# Patient Record
Sex: Female | Born: 2000 | Race: White | Hispanic: No | Marital: Single | Smoking: Never smoker
Health system: Southern US, Community
[De-identification: ages and names within clinical notes are randomized; demographics above are authoritative.]

## PROBLEM LIST (undated history)

## (undated) DIAGNOSIS — G43D0 Abdominal migraine, not intractable: Secondary | ICD-10-CM

## (undated) DIAGNOSIS — F419 Anxiety disorder, unspecified: Secondary | ICD-10-CM

## (undated) HISTORY — PX: TYMPANOSTOMY TUBE PLACEMENT: SHX32

## (undated) HISTORY — PX: NO PAST SURGERIES: SHX2092

## (undated) HISTORY — PX: ADENOIDECTOMY: SUR15

---
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
## 2020-02-01 ENCOUNTER — Other Ambulatory Visit: Payer: Self-pay

## 2020-02-01 ENCOUNTER — Encounter: Payer: Self-pay | Admitting: Nurse Practitioner

## 2020-02-01 ENCOUNTER — Ambulatory Visit: Payer: Federal, State, Local not specified - PPO | Admitting: Nurse Practitioner

## 2020-02-01 VITALS — BP 117/75 | HR 103 | Temp 98.6°F | Wt 132.6 lb

## 2020-02-01 DIAGNOSIS — D229 Melanocytic nevi, unspecified: Secondary | ICD-10-CM

## 2020-02-01 DIAGNOSIS — F411 Generalized anxiety disorder: Secondary | ICD-10-CM | POA: Diagnosis not present

## 2020-02-01 MED ORDER — FLUOXETINE HCL 40 MG PO CAPS: 40.0000 mg | ORAL_CAPSULE | Freq: Every day | ORAL | 5 refills | Status: DC

## 2020-02-01 NOTE — Assessment & Plan Note (Signed)
Small, < 2 cm, to back.  Educated her on these and will continue to monitor.  If any changes will send to dermatology for further assessment or biopsy in office.

## 2020-02-01 NOTE — Patient Instructions (Signed)

## 2020-02-01 NOTE — Progress Notes (Signed)
BP 117/75   Pulse (!) 103   Temp 98.6 F (37 C) (Oral)   Wt 132 lb 9.6 oz (60.1 kg)   LMP 01/07/2020 (Approximate)   SpO2 98%   BMI 24.25 kg/m    Subjective:    Patient ID: Brenda Rangel, female    DOB: [birth year], 19 y.o.   MRN: [id number]  HPI: Brenda Rangel is a 19 y.o. female  Chief Complaint  Patient presents with  . Anxiety  . Nevus    pt states she has 2 spots on her back that she would like looked at    ANXIETY/STRESS Increased Prozac last visit to 20 MG.  Prozac is helping somewhat, but not 100%.  Reports a lot of life issues taking place that have made anxiety worse.  Has tried Zoloft in past with no benefit.  She is going back at end of May to [facility], to her previous Social worker.  Has not felt need to use Vistaril. Duration:somewhat better, but not 100% Anxious mood: increased lately Excessive worrying: yes Irritability: at times  Sweating: no Nausea: no Palpitations:no Hyperventilation: no Panic attacks: no Agoraphobia: no  Obscessions/compulsions: at times Depressed mood: no Depression screen [facility] 2/9 02/01/2020 12/19/2019 11/16/2019 08/28/2019  Decreased Interest 1 2 2  0  Down, Depressed, Hopeless 2 0 2 0  PHQ - 2 Score 3 2 4  0  Altered sleeping 2 2 3 1   Tired, decreased energy 1 1 2 1   Change in appetite 2 3 2  0  Feeling bad or failure about yourself  2 1 1  0  Trouble concentrating 1 2 2  0  Moving slowly or fidgety/restless 3 2 1  0  Suicidal thoughts 0 0 0 0  PHQ-9 Score 14 13 15 2   Difficult doing work/chores Somewhat difficult Somewhat difficult Somewhat difficult Not difficult at all   Anhedonia: no Weight changes: no Insomnia: yes hard to stay asleep  Hypersomnia: no Fatigue/loss of energy: no Feelings of worthlessness: no Feelings of guilt: no Impaired concentration/indecisiveness: better Suicidal ideations: no  Crying spells: no Recent Stressors/Life Changes: yes   Relationship problems: no   Family stress: yes      Financial stress: no    Job stress: no    Recent death/loss: no GAD 7 : Generalized Anxiety Score 02/01/2020 12/19/2019 11/16/2019 08/28/2019  Nervous, Anxious, on Edge 3 2 3 1   Control/stop worrying 2 1 2  0  Worry too much - different things 3 0 3 0  Trouble relaxing 2 1 1  0  Restless 2 1 1  0  Easily annoyed or irritable 2 2 2  0  Afraid - awful might happen 1 0 1 0  Total GAD 7 Score 15 7 13 1   Anxiety Difficulty - Somewhat difficult Somewhat difficult Not difficult at all    SKIN LESION Has 2 nevi to back she would like looked at.  States her aunt has similar skin issues.  Areas have been present for several years, one a pediatrician told her to monitor only.  She states the areas have not changed or increased in size. Duration: years Location: back Painful: no Itching: no Onset: gradual Context: not changing Associated signs and symptoms: none History of skin cancer: no History of precancerous skin lesions: no Family history of skin cancer: no  Relevant past medical, surgical, family and social history reviewed and updated as indicated. Interim medical history since our last visit reviewed. Allergies and medications reviewed and updated.  Review of Systems  Constitutional: Negative for activity change,  appetite change, diaphoresis, fatigue and fever.  Respiratory: Negative for cough, chest tightness and shortness of breath.   Cardiovascular: Negative for chest pain, palpitations and leg swelling.  Psychiatric/Behavioral: Positive for sleep disturbance. Negative for decreased concentration, self-injury and suicidal ideas. The patient is nervous/anxious.     Per HPI unless specifically indicated above     Objective:    BP 117/75   Pulse (!) 103   Temp 98.6 F (37 C) (Oral)   Wt 132 lb 9.6 oz (60.1 kg)   LMP 01/07/2020 (Approximate)   SpO2 98%   BMI 24.25 kg/m   Wt Readings from Last 3 Encounters:  02/01/20 132 lb 9.6 oz (60.1 kg) (63 %, Z= 0.32)*  11/16/19 131 lb  (59.4 kg) (61 %, Z= 0.28)*  10/29/19 132 lb 8 oz (60.1 kg) (64 %, Z= 0.35)*   * Growth percentiles are based on CDC (Girls, 2-20 Years) data.    Physical Exam Vitals and nursing note reviewed.  Constitutional:      General: She is awake. She is not in acute distress.    Appearance: She is well-developed and well-groomed. She is not ill-appearing.  HENT:     Head: Normocephalic.     Right Ear: Hearing normal.     Left Ear: Hearing normal.  Eyes:     General: Lids are normal.        Right eye: No discharge.        Left eye: No discharge.     Conjunctiva/sclera: Conjunctivae normal.     Pupils: Pupils are equal, round, and reactive to light.  Neck:     Thyroid: No thyromegaly.     Vascular: No carotid bruit.  Cardiovascular:     Rate and Rhythm: Normal rate and regular rhythm.     Heart sounds: Normal heart sounds. No murmur. No gallop.   Pulmonary:     Effort: Pulmonary effort is normal. No accessory muscle usage or respiratory distress.     Breath sounds: Normal breath sounds.  Abdominal:     General: Bowel sounds are normal.     Palpations: Abdomen is soft.  Musculoskeletal:     Cervical back: Normal range of motion and neck supple.     Right lower leg: No edema.     Left lower leg: No edema.  Skin:    General: Skin is warm and dry.     Comments: Small, 1/2 cm nevi to upper middle back, light brown, symmetrical shape with regular borders, intact.  Small, pinpoint, deep brown skin tag mid-lower back, crusted, intact, no drainage.  Neurological:     Mental Status: She is alert and oriented to person, place, and time.  Psychiatric:        Attention and Perception: Attention normal.        Mood and Affect: Mood normal.        Speech: Speech normal.        Behavior: Behavior normal. Behavior is cooperative.        Thought Content: Thought content normal.     Results for orders placed or performed during the hospital encounter of [id number]  Basic metabolic panel  Result  Value Ref Range   Sodium 139 135 - 145 mmol/L   Potassium 3.8 3.5 - 5.1 mmol/L   Chloride 105 98 - 111 mmol/L   CO2 23 22 - 32 mmol/L   Glucose, Bld 107 (H) 70 - 99 mg/dL   BUN 10 6 - 20 mg/dL  Creatinine, Ser 0.62 0.44 - 1.00 mg/dL   Calcium 9.0 8.9 - 10.3 mg/dL   GFR calc non Af Amer >60 >60 mL/min   GFR calc Af Amer >60 >60 mL/min   Anion gap 11 5 - 15  CBC  Result Value Ref Range   WBC 8.8 4.0 - 10.5 K/uL   RBC 4.79 3.87 - 5.11 MIL/uL   Hemoglobin 13.9 12.0 - 15.0 g/dL   HCT 40.3 36.0 - 46.0 %   MCV 84.1 80.0 - 100.0 fL   MCH 29.0 26.0 - 34.0 pg   MCHC 34.5 30.0 - 36.0 g/dL   RDW 11.8 11.5 - 15.5 %   Platelets 261 150 - 400 K/uL   nRBC 0.0 0.0 - 0.2 %  Fibrin derivatives D-Dimer ([facility] only)  Result Value Ref Range   Fibrin derivatives D-dimer ([facility]) 225.90 0.00 - 499.00 ng/mL (FEU)  TSH  Result Value Ref Range   TSH 0.841 0.350 - 4.500 uIU/mL  Pregnancy, urine POC  Result Value Ref Range   Preg Test, Ur NEGATIVE NEGATIVE  Troponin I (High Sensitivity)  Result Value Ref Range   Troponin I (High Sensitivity) <2 <18 ng/L  Troponin I (High Sensitivity)  Result Value Ref Range   Troponin I (High Sensitivity) <2 <18 ng/L      Assessment & Plan:   Problem List Items Addressed This Visit      Other   Generalized anxiety disorder - Primary    Ongoing, treated in past with Zoloft with no benefit. PHQ 14 and GAD 15 today (slight increased from previous), denies SI/HI.   Will increase Prozac to 40 MG and continue Vistaril as needed for SEVERE anxiety episodes while Prozac dose changing.  Educated on medications.  Recommend meditation and relaxation exercises at home + therapy sessions.  Return in 4 weeks for follow-up.      Relevant Medications   FLUoxetine (PROZAC) 40 MG capsule   Multiple nevi    Small, < 2 cm, to back.  Educated her on these and will continue to monitor.  If any changes will send to dermatology for further assessment or biopsy in office.           Follow up plan: Return in about 4 weeks (around 02/29/2020) for Mood.

## 2020-02-01 NOTE — Assessment & Plan Note (Signed)
Ongoing, treated in past with Zoloft with no benefit. PHQ 14 and GAD 15 today (slight increased from previous), denies SI/HI.   Will increase Prozac to 40 MG and continue Vistaril as needed for SEVERE anxiety episodes while Prozac dose changing.  Educated on medications.  Recommend meditation and relaxation exercises at home + therapy sessions.  Return in 4 weeks for follow-up.

## 2020-02-29 ENCOUNTER — Encounter: Payer: Self-pay | Admitting: Nurse Practitioner

## 2020-02-29 ENCOUNTER — Ambulatory Visit: Payer: Federal, State, Local not specified - PPO | Admitting: Nurse Practitioner

## 2020-02-29 ENCOUNTER — Ambulatory Visit (INDEPENDENT_AMBULATORY_CARE_PROVIDER_SITE_OTHER): Payer: Federal, State, Local not specified - PPO | Admitting: Nurse Practitioner

## 2020-02-29 DIAGNOSIS — F411 Generalized anxiety disorder: Secondary | ICD-10-CM | POA: Diagnosis not present

## 2020-02-29 MED ORDER — FLUOXETINE HCL 40 MG PO CAPS: 40.0000 mg | ORAL_CAPSULE | Freq: Every day | ORAL | 5 refills | Status: DC

## 2020-02-29 NOTE — Progress Notes (Signed)
There were no vitals taken for this visit.   Subjective:    Patient ID: Brenda Rangel, female    DOB: 2001-04-20, 19 y.o.   MRN: [id number]  HPI: Brenda Rangel is a 19 y.o. female  Chief Complaint  Patient presents with  . Anxiety    . This visit was completed via telephone due to the restrictions of the COVID-19 pandemic. All issues as above were discussed and addressed but no physical exam was performed. If it was felt that the patient should be evaluated in the office, they were directed there. The patient verbally consented to this visit. Patient was unable to complete an audio/visual visit due to Technical difficulties,Lack of internet. Due to the catastrophic nature of the COVID-19 pandemic, this visit was done through audio contact only. . Location of the patient: home . Location of the provider: work . Those involved with this call:  . Provider: Marnee Guarneri, DNP . CMA: Yvonna Alanis, CMA . Front Desk/Registration: Don Perking  . Time spent on call: 15 minutes on the phone discussing health concerns. 10 minutes total spent in review of patient's record and preparation of their chart.  . I verified patient identity using two factors (patient name and date of birth). Patient consents verbally to being seen via telemedicine visit today.    ANXIETY/STRESS Increased Prozac last visit to 40 MG. She reports feeling benefit from this dose increase, feeling about 60% better.  Has not been taking Vistaril.  Has tried Zoloft in past with no benefit.  She is going back at end of May to [facility], to her previous Social worker.  Has not felt need to use Vistaril. Duration:somewhat better Anxious mood: improved Excessive worrying: occasional Irritability: less irritable Sweating: no Nausea: no Palpitations:no Hyperventilation: no Panic attacks: no Agoraphobia: no  Obscessions/compulsions: improving a little bit Depressed mood: no Depression screen [facility] 2/9  02/29/2020 02/01/2020 12/19/2019 11/16/2019 08/28/2019  Decreased Interest 2 1 2 2  0  Down, Depressed, Hopeless 3 2 0 2 0  PHQ - 2 Score 5 3 2 4  0  Altered sleeping 2 2 2 3 1   Tired, decreased energy 3 1 1 2 1   Change in appetite 3 2 3 2  0  Feeling bad or failure about yourself  2 2 1 1  0  Trouble concentrating 2 1 2 2  0  Moving slowly or fidgety/restless 3 3 2 1  0  Suicidal thoughts 0 0 0 0 0  PHQ-9 Score 20 14 13 15 2   Difficult doing work/chores Somewhat difficult Somewhat difficult Somewhat difficult Somewhat difficult Not difficult at all   Anhedonia: no Weight changes: no Insomnia: yes hard to stay asleep  Hypersomnia: no Fatigue/loss of energy: no Feelings of worthlessness: no Feelings of guilt: no Impaired concentration/indecisiveness: better Suicidal ideations: no  Crying spells: no Recent Stressors/Life Changes: yes   Relationship problems: no   Family stress: yes     Financial stress: no    Job stress: no    Recent death/loss: no GAD 7 : Generalized Anxiety Score 02/29/2020 02/01/2020 12/19/2019 11/16/2019  Nervous, Anxious, on Edge 2 3 2 3   Control/stop worrying 2 2 1 2   Worry too much - different things 1 3 0 3  Trouble relaxing 1 2 1 1   Restless 2 2 1 1   Easily annoyed or irritable 0 2 2 2   Afraid - awful might happen 0 1 0 1  Total GAD 7 Score 8 15 7 13   Anxiety Difficulty Somewhat difficult - Somewhat  difficult Somewhat difficult    Relevant past medical, surgical, family and social history reviewed and updated as indicated. Interim medical history since our last visit reviewed. Allergies and medications reviewed and updated.  Review of Systems  Constitutional: Negative for activity change, appetite change, diaphoresis, fatigue and fever.  Respiratory: Negative for cough, chest tightness and shortness of breath.   Cardiovascular: Negative for chest pain, palpitations and leg swelling.  Psychiatric/Behavioral: Positive for sleep disturbance. Negative for  decreased concentration, self-injury and suicidal ideas. The patient is not nervous/anxious.     Per HPI unless specifically indicated above     Objective:    There were no vitals taken for this visit.  Wt Readings from Last 3 Encounters:  02/01/20 132 lb 9.6 oz (60.1 kg) (63 %, Z= 0.32)*  11/16/19 131 lb (59.4 kg) (61 %, Z= 0.28)*  10/29/19 132 lb 8 oz (60.1 kg) (64 %, Z= 0.35)*   * Growth percentiles are based on CDC (Girls, 2-20 Years) data.    Physical Exam   Unable to perform due to telephone visit only  Results for orders placed or performed during the hospital encounter of [id number]  Basic metabolic panel  Result Value Ref Range   Sodium 139 135 - 145 mmol/L   Potassium 3.8 3.5 - 5.1 mmol/L   Chloride 105 98 - 111 mmol/L   CO2 23 22 - 32 mmol/L   Glucose, Bld 107 (H) 70 - 99 mg/dL   BUN 10 6 - 20 mg/dL   Creatinine, Ser 0.62 0.44 - 1.00 mg/dL   Calcium 9.0 8.9 - 10.3 mg/dL   GFR calc non Af Amer >60 >60 mL/min   GFR calc Af Amer >60 >60 mL/min   Anion gap 11 5 - 15  CBC  Result Value Ref Range   WBC 8.8 4.0 - 10.5 K/uL   RBC 4.79 3.87 - 5.11 MIL/uL   Hemoglobin 13.9 12.0 - 15.0 g/dL   HCT 40.3 36.0 - 46.0 %   MCV 84.1 80.0 - 100.0 fL   MCH 29.0 26.0 - 34.0 pg   MCHC 34.5 30.0 - 36.0 g/dL   RDW 11.8 11.5 - 15.5 %   Platelets 261 150 - 400 K/uL   nRBC 0.0 0.0 - 0.2 %  Fibrin derivatives D-Dimer ([facility] only)  Result Value Ref Range   Fibrin derivatives D-dimer ([facility]) 225.90 0.00 - 499.00 ng/mL (FEU)  TSH  Result Value Ref Range   TSH 0.841 0.350 - 4.500 uIU/mL  Pregnancy, urine POC  Result Value Ref Range   Preg Test, Ur NEGATIVE NEGATIVE  Troponin I (High Sensitivity)  Result Value Ref Range   Troponin I (High Sensitivity) <2 <18 ng/L  Troponin I (High Sensitivity)  Result Value Ref Range   Troponin I (High Sensitivity) <2 <18 ng/L      Assessment & Plan:   Problem List Items Addressed This Visit      Other   Generalized anxiety disorder     Ongoing, treated in past with Zoloft with no benefit. PHQ 20 and GAD 8 (improved GAD), denies SI/HI.   Continue Prozac 40 MG and continue Vistaril as needed for SEVERE anxiety episodes.  Educated on medications.  Recommend meditation and relaxation exercises at home + therapy sessions.  Therapy should offer benefit to overall mood.  Return in 6 months.      Relevant Medications   FLUoxetine (PROZAC) 40 MG capsule      I discussed the assessment and treatment plan with  the patient. The patient was provided an opportunity to ask questions and all were answered. The patient agreed with the plan and demonstrated an understanding of the instructions.   The patient was advised to call back or seek an in-person evaluation if the symptoms worsen or if the condition fails to improve as anticipated.   I provided 15+ minutes of time during this encounter.  Follow up plan: Return in about 6 months (around 08/31/2020) for MOOD.

## 2020-02-29 NOTE — Assessment & Plan Note (Signed)
Ongoing, treated in past with Zoloft with no benefit. PHQ 20 and GAD 8 (improved GAD), denies SI/HI.   Continue Prozac 40 MG and continue Vistaril as needed for SEVERE anxiety episodes.  Educated on medications.  Recommend meditation and relaxation exercises at home + therapy sessions.  Therapy should offer benefit to overall mood.  Return in 6 months.

## 2020-02-29 NOTE — Patient Instructions (Signed)

## 2020-04-03 ENCOUNTER — Ambulatory Visit: Payer: Federal, State, Local not specified - PPO | Admitting: Cardiology

## 2020-04-04 ENCOUNTER — Other Ambulatory Visit: Payer: Self-pay | Admitting: Nurse Practitioner

## 2020-04-04 ENCOUNTER — Ambulatory Visit: Payer: Federal, State, Local not specified - PPO | Admitting: Cardiology

## 2020-04-04 MED ORDER — NORETHIN ACE-ETH ESTRAD-FE 1-20 MG-MCG PO TABS: 1.0000 | ORAL_TABLET | Freq: Every day | ORAL | 12 refills | Status: DC

## 2020-04-04 NOTE — Telephone Encounter (Signed)
LOV 02/29/20

## 2020-04-04 NOTE — Telephone Encounter (Signed)
Requested medication (s) are due for refill today: no  Requested medication (s) are on the active medication list: yes  Last refill:  08/28/2019  Future visit scheduled: yes  Notes to clinic:  last filled by historical provider  Review for refill   Requested Prescriptions  Pending Prescriptions Disp Refills   norethindrone-ethinyl estradiol (JUNEL FE 1/20) 1-20 MG-MCG tablet 28 tablet 0    Sig: Take 1 tablet by mouth daily.      OB/GYN:  Contraceptives Passed - 04/04/2020 10:33 AM      Passed - Last BP in normal range    BP Readings from Last 1 Encounters:  02/01/20 117/75          Passed - Valid encounter within last 12 months    Recent Outpatient Visits           1 month ago Generalized anxiety disorder   [place], Nevis T, NP   2 months ago Generalized anxiety disorder   [place], Roosevelt T, NP   3 months ago Generalized anxiety disorder   [place], Carpenter T, NP   4 months ago Generalized anxiety disorder   [place] Cannady, Henrine Screws T, NP   6 months ago [place], Barbaraann Faster, NP       Future Appointments             In 4 months Cannady, Barbaraann Faster, NP [facility]

## 2020-04-04 NOTE — Telephone Encounter (Signed)
Medication Refill - Medication: norethindrone   Has the patient contacted their pharmacy? Yes.   (Agent: If no, request that the patient contact the pharmacy for the refill.) (Agent: If yes, when and what did the pharmacy advise?)  Preferred Pharmacy (with phone number or street name):  [place], [place] - [address]  [address] [place] [address]  Phone: [phone] Fax: [phone]  Hours: Not open 24 hours     Agent: Please be advised that RX refills may take up to 3 business days. We ask that you follow-up with your pharmacy.

## 2020-04-26 DIAGNOSIS — J040 Acute laryngitis: Secondary | ICD-10-CM | POA: Diagnosis not present

## 2020-04-28 ENCOUNTER — Other Ambulatory Visit: Payer: Self-pay | Admitting: Nurse Practitioner

## 2020-04-28 ENCOUNTER — Other Ambulatory Visit: Payer: Self-pay

## 2020-04-28 ENCOUNTER — Ambulatory Visit: Payer: Self-pay

## 2020-04-28 ENCOUNTER — Emergency Department: Payer: Federal, State, Local not specified - PPO

## 2020-04-28 ENCOUNTER — Emergency Department
Admission: EM | Admit: 2020-04-28 | Discharge: 2020-04-28 | Disposition: A | Discharge status: Home / Self Care | Payer: Federal, State, Local not specified - PPO | Attending: Emergency Medicine | Admitting: Emergency Medicine

## 2020-04-28 DIAGNOSIS — M79672 Pain in left foot: Secondary | ICD-10-CM | POA: Diagnosis not present

## 2020-04-28 DIAGNOSIS — M722 Plantar fascial fibromatosis: Secondary | ICD-10-CM | POA: Diagnosis not present

## 2020-04-28 DIAGNOSIS — M79662 Pain in left lower leg: Secondary | ICD-10-CM | POA: Diagnosis not present

## 2020-04-28 MED ORDER — TRAMADOL HCL 50 MG PO TABS: 50.0000 mg | ORAL_TABLET | Freq: Four times a day (QID) | ORAL | 0 refills | Status: DC | PRN

## 2020-04-28 MED ORDER — NAPROXEN 500 MG PO TABS: 500.0000 mg | ORAL_TABLET | Freq: Two times a day (BID) | ORAL | 0 refills | Status: DC

## 2020-04-28 MED ORDER — CYCLOBENZAPRINE HCL 10 MG PO TABS
10.0000 mg | ORAL_TABLET | Freq: Once | ORAL | Status: AC
  Administered 2020-04-28: 10 mg via ORAL
  Filled 2020-04-28: qty 1

## 2020-04-28 MED ORDER — TRAMADOL HCL 50 MG PO TABS
50.0000 mg | ORAL_TABLET | Freq: Once | ORAL | Status: AC
  Administered 2020-04-28: 50 mg via ORAL
  Filled 2020-04-28: qty 1

## 2020-04-28 NOTE — Telephone Encounter (Signed)
Pt c/o entire left leg with a burning , tingling and partial numbness. Pain is moderate.   Last night the pain and burning started shortly after she went to bed but woke her up frequently through the night. [place] stated it hurt to sit,lie down or stand. Pain was no BOTH upper thighs.   This morning when she woke her right leg no longer hurts but the entire left leg is affected. Pt + homans sign. Denies redness or swelling or color change, denies chest pain or any weakness or numbness to any other part of the body. Speech is clear. No back pain or difficulty breathing.  Pt was seen over the weekend for dx laryngitis. Pt stated strep was negative. No covid test was performed.  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  Pt is on BCP. LMP last months.  Disposition was to send pt to [facility] or see PCP within 4 hours. Contacted Christan at [facility] and both agreed that she needs to be seen at the ED to r/o DVT. Care advice given to pt and pt stated she will call her boyfriend to take to the nearest ED for evaluation. Pt advise to call EMS for any difficulty breathing or chest pain. Pt verbalized understanding.          Reason for Disposition . [1] Thigh or calf pain AND [2] only 1 side AND [3] present > 1 hour (Exception: chronic unchanged pain)  Answer Assessment - Initial Assessment Questions 1. ONSET: "When did the pain start?"     04/27/20 approx when asleep after 11:30 2. LOCATION: "Where is the pain located?"      Left leg,  3. PAIN: "How bad is the pain?"    (Scale 1-10; or mild, moderate, severe)   -  MILD (1-3): doesn't interfere with normal activities    -  MODERATE (4-7): interferes with normal activities (e.g., work or school) or awakens from sleep, limping    -  SEVERE (8-10): excruciating pain, unable to do any normal activities, unable to walk     Burning and tingling and numbness, can walk on but hurts, feels  weak 4. 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 CAUSE: "What do you think is causing the leg pain?"    Excessive walking 6. OTHER SYMPTOMS: "Do you have any other symptoms?" (e.g., chest pain, back pain, breathing difficulty, swelling, rash, fever, numbness, weakness)     Calf area pain,tingling and numbness  7. PREGNANCY: "Is there any chance you are pregnant?" "When was your last menstrual period?"    No- LMP last month  Protocols used: LEG PAIN-A-AH

## 2020-04-28 NOTE — ED Notes (Signed)
Pt presents c/o left lower leg pain. Per patient pain was intermittent overnight bilaterally but now pain is mostly in left leg radiating from knee to ankle. Patient reports pain as cramping, tingling, and an uncomfortable numbness. No acute distress noted.

## 2020-04-28 NOTE — ED Triage Notes (Signed)
Pt states she had been having right leg pain and states today is all shifted to the left calf and is concerned for a DVT. No noted swelling or redness.

## 2020-04-28 NOTE — Discharge Instructions (Signed)
Your ultrasound was negative for DVT.  Follow discharge care instruction.  Finish your prednisone today.  Start naproxen in the morning.  Consider podiatry consult.

## 2020-04-28 NOTE — Telephone Encounter (Signed)
Agree with this plan and will need follow-up after ER visit.

## 2020-04-28 NOTE — ED Provider Notes (Signed)
[facility] Emergency Department Provider Note   ____________________________________________   First MD Initiated Contact with Patient 04/28/20 1137     (approximate)  I have reviewed the triage vital signs and the nursing notes.   HISTORY  Chief Complaint Leg Pain    HPI Brenda Rangel is a 19 y.o. female patient presents with bilateral lower extremity pain.  Pain stated left greater than right.  Patient states she was seen by urgent care 2 days ago and started on steroids.  Patient states last dose is due today.  Patient was concerned for DVT after talking to her PCP.  Patient does take birth control.  Patient denies chest pain or dyspnea.  Patient rates her pain a 6/10.  Patient described the pain is "achy".  Pain increased with ambulation.  Patient recently started a job as required increase physical activities.         Past Medical History:  Diagnosis Date  . Abdominal migraine     Patient Active Problem List   Diagnosis Date Noted  . Multiple nevi 02/01/2020  . Generalized anxiety disorder 11/16/2019  . Tachycardia 09/07/2019  . Healthy female adolescent 08/28/2019  . Uses birth control 08/28/2019    History reviewed. No pertinent surgical history.  Prior to Admission medications   Medication Sig Start Date End Date Taking? Authorizing Provider  FLUoxetine (PROZAC) 40 MG capsule Take 1 capsule (40 mg total) by mouth daily. 02/29/20   Cannady, Henrine Screws T, NP  hydrOXYzine (ATARAX/VISTARIL) 10 MG tablet Take 1 tablet (10 mg total) by mouth 3 (three) times daily as needed. 11/16/19   Cannady, Henrine Screws T, NP  naproxen (NAPROSYN) 500 MG tablet Take 1 tablet (500 mg total) by mouth 2 (two) times daily with a meal. Starting tomorrow morning 04/28/20   Sable Feil, PA-C  norethindrone-ethinyl estradiol (JUNEL FE 1/20) 1-20 MG-MCG tablet Take 1 tablet by mouth daily. 04/04/20   Cannady, Henrine Screws T, NP  traMADol (ULTRAM) 50 MG tablet Take 1 tablet (50  mg total) by mouth every 6 (six) hours as needed for moderate pain. 04/28/20   Sable Feil, PA-C    Allergies Patient has no known allergies.  Family History  Problem Relation Age of Onset  . Pectus excavatum Father   . Pectus excavatum Sister   . Breast cancer Maternal Grandmother     Social History Social History   Tobacco Use  . Smoking status: Never Smoker  . Smokeless tobacco: Never Used  Vaping Use  . Vaping Use: Never used  Substance Use Topics  . Alcohol use: Yes    Comment: soccially  . Drug use: Never    Review of Systems Constitutional: No fever/chills Eyes: No visual changes. ENT: No sore throat. Cardiovascular: Denies chest pain. Respiratory: Denies shortness of breath. Gastrointestinal: No abdominal pain.  No nausea, no vomiting.  No diarrhea.  No constipation. Genitourinary: Negative for dysuria. Musculoskeletal: Positive for lower extremity pain.   Skin: Negative for rash. Neurological: Negative for headaches, focal weakness or numbness.   ____________________________________________   PHYSICAL EXAM:  VITAL SIGNS: ED Triage Vitals [04/28/20 0947]  Enc Vitals Group     BP 122/88     Pulse Rate 97     Resp 16     Temp 98.6 F (37 C)     Temp Source Oral     SpO2 100 %     Weight 130 lb (59 kg)     Height 5\' 2"  (1.575 m)  Head Circumference      Peak Flow      Pain Score 6     Pain Loc      Pain Edu?      Excl. in [place]?     Constitutional: Alert and oriented. Well appearing and in no acute distress. Cardiovascular: Normal rate, regular rhythm. Grossly normal heart sounds.  Good peripheral circulation. Respiratory: Normal respiratory effort.  No retractions. Lungs CTAB. Musculoskeletal: No obvious deformity left lower extremity.  Patient is moderate guarding palpation of the ankle, calf, and thigh.   Neurologic:  Normal speech and language. No gross focal neurologic deficits are appreciated. No gait instability. Skin:  Skin is  warm, dry and intact. No rash noted. Psychiatric: Mood and affect are normal. Speech and behavior are normal.  ____________________________________________   LABS (all labs ordered are listed, but only abnormal results are displayed)  Labs Reviewed - No data to display ____________________________________________  EKG   ____________________________________________  RADIOLOGY  ED MD interpretation:    Official radiology report(s): US Venous Img Lower Unilateral Left  Result Date: 04/28/2020 CLINICAL DATA:  Left calf pain. EXAM: LEFT LOWER EXTREMITY VENOUS DOPPLER ULTRASOUND TECHNIQUE: Gray-scale sonography with graded compression, as well as color Doppler and duplex ultrasound were performed to evaluate the lower extremity deep venous systems from the level of the common femoral vein and including the common femoral, femoral, profunda femoral, popliteal and calf veins including the posterior tibial, peroneal and gastrocnemius veins when visible. The superficial great saphenous vein was also interrogated. Spectral Doppler was utilized to evaluate flow at rest and with distal augmentation maneuvers in the common femoral, femoral and popliteal veins. COMPARISON:  None. FINDINGS: Contralateral Common Femoral Vein: Respiratory phasicity is normal and symmetric with the symptomatic side. No evidence of thrombus. Normal compressibility. Common Femoral Vein: No evidence of thrombus. Normal compressibility, respiratory phasicity and response to augmentation. Saphenofemoral Junction: No evidence of thrombus. Normal compressibility and flow on color Doppler imaging. Profunda Femoral Vein: No evidence of thrombus. Normal compressibility and flow on color Doppler imaging. Femoral Vein: No evidence of thrombus. Normal compressibility, respiratory phasicity and response to augmentation. Popliteal Vein: No evidence of thrombus. Normal compressibility, respiratory phasicity and response to augmentation. Calf  Veins: No evidence of thrombus. Normal compressibility and flow on color Doppler imaging. Superficial Great Saphenous Vein: No evidence of thrombus. Normal compressibility. Venous Reflux:  None. Other Findings: No evidence of superficial thrombophlebitis or abnormal fluid collection. IMPRESSION: No evidence of left lower extremity deep venous thrombosis. Electronically Signed   By: Aletta Edouard M.D.   On: 04/28/2020 11:20    ____________________________________________   PROCEDURES  Procedure(s) performed (including Critical Care):  Procedures   ____________________________________________   INITIAL IMPRESSION / ASSESSMENT AND PLAN / ED COURSE  As part of my medical decision making, I reviewed the following data within the [place]      Patient presents with bilateral lower extremity pain left greater than right.  Discussed negative ultrasound findings of the left lower extremity.  Patient complaining physical exam consistent with plantar fasciitis affecting the lower extremity.  Patient given discharge care instruction advised take medication as directed.  Patient was given a consult to podiatry for definitive evaluation and treatment.    Neshia Mckenzie was evaluated in Emergency Department on 04/28/2020 for the symptoms described in the history of present illness. She was evaluated in the context of the global COVID-19 pandemic, which necessitated consideration that the patient might be at risk for infection  with the SARS-CoV-2 virus that causes COVID-19. Institutional protocols and algorithms that pertain to the evaluation of patients at risk for COVID-19 are in a state of rapid change based on information released by regulatory bodies including the CDC and federal and state organizations. These policies and algorithms were followed during the patient's care in the ED.       ____________________________________________   FINAL CLINICAL IMPRESSION(S) / ED  DIAGNOSES  Final diagnoses:  Plantar fasciitis of left foot     ED Discharge Orders         Ordered    naproxen (NAPROSYN) 500 MG tablet  2 times daily with meals     Discontinue  Reprint     04/28/20 1216    traMADol (ULTRAM) 50 MG tablet  Every 6 hours PRN     Discontinue  Reprint     04/28/20 1216           Note:  This document was prepared using Dragon voice recognition software and may include unintentional dictation errors.    Sable Feil, PA-C 04/28/20 1222    Duffy Bruce, MD 04/29/20 [phone]

## 2020-04-30 DIAGNOSIS — F411 Generalized anxiety disorder: Secondary | ICD-10-CM | POA: Diagnosis not present

## 2020-05-05 DIAGNOSIS — F411 Generalized anxiety disorder: Secondary | ICD-10-CM | POA: Diagnosis not present

## 2020-05-05 DIAGNOSIS — F330 Major depressive disorder, recurrent, mild: Secondary | ICD-10-CM | POA: Diagnosis not present

## 2020-05-06 ENCOUNTER — Ambulatory Visit (INDEPENDENT_AMBULATORY_CARE_PROVIDER_SITE_OTHER): Payer: Federal, State, Local not specified - PPO

## 2020-05-06 ENCOUNTER — Other Ambulatory Visit: Payer: Self-pay | Admitting: Podiatry

## 2020-05-06 ENCOUNTER — Ambulatory Visit: Payer: Federal, State, Local not specified - PPO | Admitting: Podiatry

## 2020-05-06 ENCOUNTER — Other Ambulatory Visit: Payer: Self-pay

## 2020-05-06 DIAGNOSIS — M79672 Pain in left foot: Secondary | ICD-10-CM

## 2020-05-06 DIAGNOSIS — T148XXA Other injury of unspecified body region, initial encounter: Secondary | ICD-10-CM

## 2020-05-06 DIAGNOSIS — M79671 Pain in right foot: Secondary | ICD-10-CM

## 2020-05-06 DIAGNOSIS — M791 Myalgia, unspecified site: Secondary | ICD-10-CM | POA: Diagnosis not present

## 2020-05-07 ENCOUNTER — Encounter: Payer: Self-pay | Admitting: Podiatry

## 2020-05-07 NOTE — Progress Notes (Signed)
Subjective:  Patient ID: Brenda Rangel, female    DOB: Feb 01, 2001,  MRN: [id number]  Chief Complaint  Patient presents with  . Foot Pain    pt is here for left foot pain, possible plantar fasciitis, pt states that the pain is not painful but is looking to get a second opinion    19 y.o. female presents with the above complaint.  Patient presents With a complaint of generalized aches to the left lower extremity.  Patient states it was hurting in the back of the calf for a while patient went to the emergency room for which they ruled out any DVT but they told her that she had possible plantar fasciitis.  Which to patient did not make sense that they wanted to get a second opinion get it evaluated.  Currently patient does not have any aches or pains to the left lower extremity.  She denies any pain anywhere to the lower extremity.  She denies any other acute complaints.  She has not seen anyone else prior to seeing me.   Review of Systems: Negative except as noted in the HPI. Denies N/V/F/Ch.  Past Medical History:  Diagnosis Date  . Abdominal migraine     Current Outpatient Medications:  .  amoxicillin (AMOXIL) 500 MG capsule, Take 500 mg by mouth 2 (two) times daily., Disp: , Rfl:  .  FLUoxetine (PROZAC) 40 MG capsule, Take 1 capsule (40 mg total) by mouth daily., Disp: 90 capsule, Rfl: 5 .  hydrOXYzine (ATARAX/VISTARIL) 10 MG tablet, Take 1 tablet (10 mg total) by mouth 3 (three) times daily as needed., Disp: 30 tablet, Rfl: 0 .  naproxen (NAPROSYN) 500 MG tablet, Take 1 tablet (500 mg total) by mouth 2 (two) times daily with a meal. Starting tomorrow morning, Disp: 20 tablet, Rfl: 0 .  norethindrone-ethinyl estradiol (JUNEL FE 1/20) 1-20 MG-MCG tablet, Take 1 tablet by mouth daily., Disp: 28 tablet, Rfl: 12 .  predniSONE (DELTASONE) 20 MG tablet, Take 40 mg by mouth daily., Disp: , Rfl:  .  traMADol (ULTRAM) 50 MG tablet, Take 1 tablet (50 mg total) by mouth every 6 (six) hours as  needed for moderate pain., Disp: 12 tablet, Rfl: 0  Social History   Tobacco Use  Smoking Status Never Smoker  Smokeless Tobacco Never Used    No Known Allergies Objective:  There were no vitals filed for this visit. There is no height or weight on file to calculate BMI. Constitutional Well developed. Well nourished.  Vascular Dorsalis pedis pulses palpable bilaterally. Posterior tibial pulses palpable bilaterally. Capillary refill normal to all digits.  No cyanosis or clubbing noted. Pedal hair growth normal.  Neurologic Normal speech. Oriented to person, place, and time. Epicritic sensation to light touch grossly present bilaterally.  Dermatologic Nails well groomed and normal in appearance. No open wounds. No skin lesions.  Orthopedic:  No pain on palpation to the left lower extremity.  No pain at the calcaneal tuber at the origin of the plantar fascia.  No pain at the posterior tibial tendon, peroneal tendon, ATFL   Radiographs: 3 views of skeletally mature adult left foot: No osseous abnormalities noted.  No fractures noted.  No heel spurring noted. Assessment:   1. Foot pain, right   2. Muscle contusion   3. Muscle ache    Plan:  Patient was evaluated and treated and all questions answered.  Left general muscle ache/contusion -Clinically given the patient's muscle ache/contusion has completely resolved we will plan on no intervention  at this time.  X-rays were also negative for any osseous involvement. -It appears that patient may have overexerted herself with daily activities that may lead to possible calf pain.  In the ER DVT was ruled out.  Today patient does not have any clinical signs of pain anywhere in the left lower extremity. -I will see her back as needed for any foot and ankle issues in the future.  No follow-ups on file.

## 2020-05-21 DIAGNOSIS — F330 Major depressive disorder, recurrent, mild: Secondary | ICD-10-CM | POA: Diagnosis not present

## 2020-05-21 DIAGNOSIS — F411 Generalized anxiety disorder: Secondary | ICD-10-CM | POA: Diagnosis not present

## 2020-05-23 ENCOUNTER — Ambulatory Visit: Payer: Federal, State, Local not specified - PPO | Admitting: Nurse Practitioner

## 2020-05-23 ENCOUNTER — Encounter: Payer: Self-pay | Admitting: Nurse Practitioner

## 2020-05-23 ENCOUNTER — Other Ambulatory Visit: Payer: Self-pay

## 2020-05-23 VITALS — BP 128/85 | HR 101 | Temp 98.7°F | Wt 132.4 lb

## 2020-05-23 DIAGNOSIS — Z1159 Encounter for screening for other viral diseases: Secondary | ICD-10-CM

## 2020-05-23 DIAGNOSIS — A749 Chlamydial infection, unspecified: Secondary | ICD-10-CM

## 2020-05-23 DIAGNOSIS — Z113 Encounter for screening for infections with a predominantly sexual mode of transmission: Secondary | ICD-10-CM | POA: Diagnosis not present

## 2020-05-23 DIAGNOSIS — B079 Viral wart, unspecified: Secondary | ICD-10-CM | POA: Diagnosis not present

## 2020-05-23 LAB — PREGNANCY, URINE: Preg Test, Ur: NEGATIVE

## 2020-05-23 NOTE — Progress Notes (Signed)
BP 128/85 (BP Location: Right Arm, Patient Position: Sitting, Cuff Size: Normal)   Pulse (!) 101   Temp 98.7 F (37.1 C) (Oral)   Wt 132 lb 6.4 oz (60.1 kg)   LMP 04/28/2020 (Approximate)   SpO2 100%   BMI 24.22 kg/m    Subjective:    Patient ID: Brenda Rangel, female    DOB: September 15, 2001, 19 y.o.   MRN: [id number]  HPI: Brenda Rangel is a 19 y.o. female presenting for exposure to STD.  Chief Complaint  Patient presents with  . Exposure to STD   STD SCREENING Patient reports she recently found out her boyfriend was cheating on her and he tested positive for Chlamydia.  Has noticed in the past months that after rough sex, has been more painful.    Sexual activity:  Recent unprotected sexual encounter Contraception: yes Recent unprotected intercourse: yes; 2 nights ago last night History of sexually transmitted diseases: no Previous sexually transmitted disease screening: yes Lifetime sexual partners:   Genital lesions: no Dysuria: no Swollen lymph nodes: no Fevers: no Rash: no   SKIN LESION Patient reports a wart on her thumb that has been persistent.  Wondering if it can be frozen today. Duration: months Location: right thumb Painful: no Itching: no Onset: gradual Context: not changing Associated signs and symptoms: none History of skin cancer: no History of precancerous skin lesions: no Family history of skin cancer: no   No Known Allergies  Outpatient Encounter Medications as of 05/23/2020  Medication Sig  . FLUoxetine (PROZAC) 40 MG capsule Take 1 capsule (40 mg total) by mouth daily.  . norethindrone-ethinyl estradiol (JUNEL FE 1/20) 1-20 MG-MCG tablet Take 1 tablet by mouth daily.  . hydrOXYzine (ATARAX/VISTARIL) 10 MG tablet Take 1 tablet (10 mg total) by mouth 3 (three) times daily as needed. (Patient not taking: Reported on 05/23/2020)  . [DISCONTINUED] amoxicillin (AMOXIL) 500 MG capsule Take 500 mg by mouth 2 (two) times daily.  . [DISCONTINUED]  naproxen (NAPROSYN) 500 MG tablet Take 1 tablet (500 mg total) by mouth 2 (two) times daily with a meal. Starting tomorrow morning  . [DISCONTINUED] predniSONE (DELTASONE) 20 MG tablet Take 40 mg by mouth daily.  . [DISCONTINUED] traMADol (ULTRAM) 50 MG tablet Take 1 tablet (50 mg total) by mouth every 6 (six) hours as needed for moderate pain.   No facility-administered encounter medications on file as of 05/23/2020.   Patient Active Problem List   Diagnosis Date Noted  . Wart on thumb 05/23/2020  . Multiple nevi 02/01/2020  . Generalized anxiety disorder 11/16/2019  . Tachycardia 09/07/2019  . Healthy female adolescent 08/28/2019  . Uses birth control 08/28/2019   Past Medical History:  Diagnosis Date  . Abdominal migraine    Relevant past medical, surgical, family and social history reviewed and updated as indicated. Interim medical history since our last visit reviewed.  Review of Systems  Constitutional: Negative.  Negative for activity change and fever.  Genitourinary: Negative.  Negative for decreased urine volume, dysuria, flank pain, frequency, genital sores, hematuria, menstrual problem, urgency and vaginal discharge.  Musculoskeletal: Negative.  Negative for back pain and joint swelling.  Skin: Negative.  Negative for rash and wound.  Neurological: Negative.   Psychiatric/Behavioral: Negative.  Negative for confusion.    Per HPI unless specifically indicated above     Objective:    BP 128/85 (BP Location: Right Arm, Patient Position: Sitting, Cuff Size: Normal)   Pulse (!) 101   Temp 98.7 F (37.1  C) (Oral)   Wt 132 lb 6.4 oz (60.1 kg)   LMP 04/28/2020 (Approximate)   SpO2 100%   BMI 24.22 kg/m   Wt Readings from Last 3 Encounters:  05/23/20 132 lb 6.4 oz (60.1 kg) (61 %, Z= 0.28)*  04/28/20 130 lb (59 kg) (57 %, Z= 0.18)*  02/01/20 132 lb 9.6 oz (60.1 kg) (63 %, Z= 0.32)*   * Growth percentiles are based on CDC (Girls, 2-20 Years) data.    Physical  Exam Vitals reviewed.  Constitutional:      General: She is not in acute distress.    Appearance: Normal appearance. She is not toxic-appearing.  Abdominal:     Palpations: Abdomen is soft.  Genitourinary:    Comments: Deferred using shared decision making Musculoskeletal:        General: Normal range of motion.     Right lower leg: No edema.     Left lower leg: No edema.  Skin:    General: Skin is warm and dry.     Coloration: Skin is not jaundiced or pale.  Neurological:     General: No focal deficit present.     Mental Status: She is alert and oriented to person, place, and time.     Motor: No weakness.     Gait: Gait normal.  Psychiatric:        Mood and Affect: Mood normal.        Behavior: Behavior normal.        Thought Content: Thought content normal.        Judgment: Judgment normal.       Assessment & Plan:   Problem List Items Addressed This Visit      Musculoskeletal and Integument   Wart on thumb    Acute, ongoing.  Given sensitive area, advised to try OTC wart removal and if did not work, would freeze in office.       Other Visit Diagnoses    Need for hepatitis C screening test    -  Primary   HCV checked today.  Await results.   Relevant Orders   Hepatitis C antibody   Routine screening for STI (sexually transmitted infection)       Will check STI testing and treat based on results.  Encouraged condom use.   Relevant Orders   GC/Chlamydia Probe Amp   HIV Antibody (routine testing w rflx)   RPR   HSV(herpes smplx)abs-1+2(IgG+IgM)-bld   Pregnancy, urine       Follow up plan: Return if symptoms worsen or fail to improve.

## 2020-05-23 NOTE — Patient Instructions (Signed)
Take care, Brenda Rangel.  We will message you with your results on Monday.   Please let us know if you have any questions or concerns in the meantime.  [name]

## 2020-05-23 NOTE — Assessment & Plan Note (Signed)
Acute, ongoing.  Given sensitive area, advised to try OTC wart removal and if did not work, would freeze in office.

## 2020-05-25 LAB — GC/CHLAMYDIA PROBE AMP
Chlamydia trachomatis, NAA: POSITIVE — AB
Neisseria Gonorrhoeae by PCR: NEGATIVE

## 2020-05-26 MED ORDER — DOXYCYCLINE HYCLATE 100 MG PO TABS: 100.0000 mg | ORAL_TABLET | Freq: Two times a day (BID) | ORAL | 0 refills | Status: DC

## 2020-05-26 NOTE — Addendum Note (Signed)
Addended by: Carnella Guadalajara I on: 05/26/2020 08:45 AM   Modules accepted: Orders

## 2020-05-28 LAB — HSV(HERPES SMPLX)ABS-I+II(IGG+IGM)-BLD
HSV 1 Glycoprotein G Ab, IgG: <0.91 index (ref 0.00–0.90)
HSV 2 IgG, Type Spec: <0.91 index (ref 0.00–0.90)
HSVI/II Comb IgM: 0.97 Ratio — ABNORMAL HIGH (ref 0.00–0.90)

## 2020-05-28 LAB — RPR: RPR Ser Ql: NONREACTIVE

## 2020-05-28 LAB — HIV ANTIBODY (ROUTINE TESTING W REFLEX): HIV Screen 4th Generation wRfx: NONREACTIVE

## 2020-05-28 LAB — HEPATITIS C ANTIBODY: Hep C Virus Ab: <0.1 s/co ratio (ref 0.0–0.9)

## 2020-06-04 ENCOUNTER — Other Ambulatory Visit: Payer: Self-pay | Admitting: Nurse Practitioner

## 2020-06-04 DIAGNOSIS — Z20828 Contact with and (suspected) exposure to other viral communicable diseases: Secondary | ICD-10-CM

## 2020-06-04 DIAGNOSIS — F330 Major depressive disorder, recurrent, mild: Secondary | ICD-10-CM | POA: Diagnosis not present

## 2020-06-04 DIAGNOSIS — F411 Generalized anxiety disorder: Secondary | ICD-10-CM | POA: Diagnosis not present

## 2020-06-25 ENCOUNTER — Other Ambulatory Visit: Payer: Self-pay

## 2020-06-25 ENCOUNTER — Other Ambulatory Visit: Payer: Federal, State, Local not specified - PPO

## 2020-06-25 DIAGNOSIS — Z20822 Contact with and (suspected) exposure to covid-19: Secondary | ICD-10-CM | POA: Diagnosis not present

## 2020-06-27 LAB — SARS-COV-2, NAA 2 DAY TAT

## 2020-06-27 LAB — NOVEL CORONAVIRUS, NAA: SARS-CoV-2, NAA: NOT DETECTED

## 2020-07-23 DIAGNOSIS — F330 Major depressive disorder, recurrent, mild: Secondary | ICD-10-CM | POA: Diagnosis not present

## 2020-07-23 DIAGNOSIS — F411 Generalized anxiety disorder: Secondary | ICD-10-CM | POA: Diagnosis not present

## 2020-08-13 ENCOUNTER — Ambulatory Visit: Payer: Federal, State, Local not specified - PPO | Admitting: Nurse Practitioner

## 2020-08-13 ENCOUNTER — Other Ambulatory Visit: Payer: Self-pay

## 2020-08-13 ENCOUNTER — Encounter: Payer: Self-pay | Admitting: Nurse Practitioner

## 2020-08-13 VITALS — BP 115/84 | HR 96 | Temp 98.3°F | Resp 16 | Wt 128.0 lb

## 2020-08-13 DIAGNOSIS — M25551 Pain in right hip: Secondary | ICD-10-CM | POA: Diagnosis not present

## 2020-08-13 DIAGNOSIS — F411 Generalized anxiety disorder: Secondary | ICD-10-CM

## 2020-08-13 DIAGNOSIS — M25552 Pain in left hip: Secondary | ICD-10-CM

## 2020-08-13 DIAGNOSIS — Z113 Encounter for screening for infections with a predominantly sexual mode of transmission: Secondary | ICD-10-CM

## 2020-08-13 NOTE — Assessment & Plan Note (Signed)
Ongoing, no worsening.  She declines ortho referral or imaging today.  Recommend Tylenol as needed at home or Ibuprofen for discomfort.  Recommend daily stretching and yoga to help with flexibility.  If worsening or ongoing plan to refer to ortho and obtain imaging, she will alert provider.

## 2020-08-13 NOTE — Progress Notes (Signed)
BP 115/84 (BP Location: Left Arm, Patient Position: Sitting, Cuff Size: Normal)   Pulse 96 Comment: apical  Temp 98.3 F (36.8 C) (Oral)   Resp 16   Wt 128 lb (58.1 kg)   BMI 23.41 kg/m    Subjective:    Patient ID: Brenda Rangel, female    DOB: 2001-02-06, 19 y.o.   MRN: [id number]  HPI: Brenda Rangel is a 19 y.o. female  Chief Complaint  Patient presents with  . Follow-up   ANXIETY/STRESS Taking Prozac 40 MG. She reports feeling benefit from this dose increase, feeling about 60% better. Has not been taking Vistaril.  Has tried Zoloft in past with no benefit.  She attends [employer].  Would like STD retesting and urine checked -- had chlamydia treated months back and abnormal HSV labs. Duration:somewhat better Anxious mood: improved Excessive worrying: occasional Irritability:less irritable Sweating:no Nausea:no Palpitations:no Hyperventilation:no Panic attacks:no Agoraphobia:no Obscessions/compulsions:improving a little bit Depressed mood:no Depression screen [facility] 2/9 08/13/2020 02/29/2020 02/01/2020 12/19/2019 11/16/2019  Decreased Interest 2 2 1 2 2   Down, Depressed, Hopeless 2 3 2  0 2  PHQ - 2 Score 4 5 3 2 4   Altered sleeping 1 2 2 2 3   Tired, decreased energy 2 3 1 1 2   Change in appetite 2 3 2 3 2   Feeling bad or failure about yourself  - 2 2 1 1   Trouble concentrating 2 2 1 2 2   Moving slowly or fidgety/restless 0 3 3 2 1   Suicidal thoughts 0 0 0 0 0  PHQ-9 Score 11 20 14 13 15   Difficult doing work/chores Not difficult at all Somewhat difficult Somewhat difficult Somewhat difficult Somewhat difficult  Anhedonia: no Weight changes: no Insomnia: yes hard to stay asleep  Hypersomnia: no Fatigue/loss of energy: no Feelings of worthlessness: no Feelings of guilt: no Impaired concentration/indecisiveness: better Suicidal ideations: no  Crying spells: no Recent Stressors/Life Changes: yes   Relationship problems: no   Family stress: yes      Financial stress: no    Job stress: no    Recent death/loss: no  GAD 7 : Generalized Anxiety Score 08/13/2020 02/29/2020 02/01/2020 12/19/2019  Nervous, Anxious, on Edge 2 2 3 2   Control/stop worrying 2 2 2 1   Worry too much - different things 1 1 3  0  Trouble relaxing 1 1 2 1   Restless 0 2 2 1   Easily annoyed or irritable 1 0 2 2  Afraid - awful might happen 0 0 1 0  Total GAD 7 Score 7 8 15 7   Anxiety Difficulty - Somewhat difficult - Somewhat difficult   HIP PAIN About once a month presents to one or other hip, it happens when she gets out of bed in morning and then lasts 3-4 days. Last time this happened was in right hip and this is when she had pain in right leg too -- in July 2021.  Her mother has hip dysplasia, does not see anyone for this.  Last flare of this was last month.  She feels like her hip pops out sometimes. Duration: months Involved hip: bilateral  Mechanism of injury: unknown Location: anterior Onset: gradual  Severity: 7/10  Quality: dull and aching Frequency: intermittent Radiation: radiated down right leg one time Aggravating factors: weight bearing and walking  Alleviating factors: resting Status: fluctuating Treatments attempted: rest   Relief with NSAIDs?: No NSAIDs Taken Weakness with weight bearing: no Weakness with walking: no Paresthesias / decreased sensation: no Swelling: no Redness:no Fevers:  no  Relevant past medical, surgical, family and social history reviewed and updated as indicated. Interim medical history since our last visit reviewed. Allergies and medications reviewed and updated.  Review of Systems  Constitutional: Negative for activity change, appetite change, diaphoresis, fatigue and fever.  Respiratory: Negative for cough, chest tightness and shortness of breath.   Cardiovascular: Negative for chest pain, palpitations and leg swelling.  Genitourinary: Negative.   Musculoskeletal: Positive for arthralgias.   Psychiatric/Behavioral: Positive for sleep disturbance. Negative for decreased concentration, self-injury and suicidal ideas. The patient is not nervous/anxious.     Per HPI unless specifically indicated above     Objective:    BP 115/84 (BP Location: Left Arm, Patient Position: Sitting, Cuff Size: Normal)   Pulse 96 Comment: apical  Temp 98.3 F (36.8 C) (Oral)   Resp 16   Wt 128 lb (58.1 kg)   BMI 23.41 kg/m   Wt Readings from Last 3 Encounters:  08/13/20 128 lb (58.1 kg) (52 %, Z= 0.06)*  05/23/20 132 lb 6.4 oz (60.1 kg) (61 %, Z= 0.28)*  04/28/20 130 lb (59 kg) (57 %, Z= 0.18)*   * Growth percentiles are based on CDC (Girls, 2-20 Years) data.    Physical Exam Vitals and nursing note reviewed.  Constitutional:      General: She is awake. She is not in acute distress.    Appearance: She is well-developed and well-groomed. She is not ill-appearing.  HENT:     Head: Normocephalic.     Right Ear: Hearing normal.     Left Ear: Hearing normal.  Eyes:     General: Lids are normal.        Right eye: No discharge.        Left eye: No discharge.     Conjunctiva/sclera: Conjunctivae normal.     Pupils: Pupils are equal, round, and reactive to light.  Neck:     Thyroid: No thyromegaly.     Vascular: No carotid bruit.  Cardiovascular:     Rate and Rhythm: Normal rate and regular rhythm.     Heart sounds: Normal heart sounds. No murmur heard.  No gallop.   Pulmonary:     Effort: Pulmonary effort is normal. No accessory muscle usage or respiratory distress.     Breath sounds: Normal breath sounds.  Abdominal:     General: Bowel sounds are normal.     Palpations: Abdomen is soft.  Musculoskeletal:     Cervical back: Normal range of motion and neck supple.     Right hip: Normal.     Left hip: Normal.     Right lower leg: No edema.     Left lower leg: No edema.     Comments: Hip exam overall normal and spine no scoliosis noted.  Skin:    General: Skin is warm and dry.   Neurological:     Mental Status: She is alert and oriented to person, place, and time.  Psychiatric:        Attention and Perception: Attention normal.        Mood and Affect: Mood normal.        Speech: Speech normal.        Behavior: Behavior normal. Behavior is cooperative.        Thought Content: Thought content normal.    Results for orders placed or performed in visit on 08/13/20  Microscopic Examination   Urine  Result Value Ref Range   WBC, UA 6-10 (A) 0 -  5 /hpf   RBC 3-10 (A) 0 - 2 /hpf   Epithelial Cells (non renal) 0-10 0 - 10 /hpf   Bacteria, UA Many (A) None seen/Few  Urine Culture, Reflex   Urine  Result Value Ref Range   Urine Culture, Routine WILL FOLLOW   UA/M w/rflx Culture, Routine   Specimen: Urine   Urine  Result Value Ref Range   Specific Gravity, UA 1.010 1.005 - 1.030   pH, UA 6.0 5.0 - 7.5   Color, UA Yellow Yellow   Appearance Ur Clear Clear   Leukocytes,UA Trace (A) Negative   Protein,UA Negative Negative/Trace   Glucose, UA Negative Negative   Ketones, UA Negative Negative   RBC, UA Trace (A) Negative   Bilirubin, UA Negative Negative   Urobilinogen, Ur 0.2 0.2 - 1.0 mg/dL   Nitrite, UA Negative Negative   Microscopic Examination See below:    Urinalysis Reflex Comment       Assessment & Plan:   Problem List Items Addressed This Visit      Other   Generalized anxiety disorder - Primary    Ongoing, treated in past with Zoloft with no benefit.  Denies SI/HI.  Continue Prozac 40 MG and continue Vistaril as needed for SEVERE anxiety episodes.  Educated on medications.  Recommend meditation and relaxation exercises at home + therapy sessions.  Therapy should offer benefit to overall mood.  Return in 6 months.      Hip pain, bilateral    Ongoing, no worsening.  She declines ortho referral or imaging today.  Recommend Tylenol as needed at home or Ibuprofen for discomfort.  Recommend daily stretching and yoga to help with flexibility.  If  worsening or ongoing plan to refer to ortho and obtain imaging, she will alert provider.       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  UA performed.   Relevant Orders   HSV(herpes simplex vrs) 1+2 ab-IgG   GC/Chlamydia Probe Amp   UA/M w/rflx Culture, Routine (Completed)       Follow up plan: Return in about 6 months (around 02/11/2021) for MOOD.

## 2020-08-13 NOTE — Assessment & Plan Note (Signed)
Ongoing, treated in past with Zoloft with no benefit.  Denies SI/HI.  Continue Prozac 40 MG and continue Vistaril as needed for SEVERE anxiety episodes.  Educated on medications.  Recommend meditation and relaxation exercises at home + therapy sessions.  Therapy should offer benefit to overall mood.  Return in 6 months.

## 2020-08-13 NOTE — Patient Instructions (Addendum)
Takes Vitamin D3 2000 units, Zinc, Vitamin C (good for UTIs too) are good for helping cold prevention.  Probiotic daily for stomach.  Take a daily allergy medication like Zyrtec.   Managing Anxiety, Adult After being diagnosed with an anxiety disorder, you may be relieved to know why you have felt or behaved a certain way. You may also feel overwhelmed about the treatment ahead and what it will mean for your life. With care and support, you can manage this condition and recover from it. How to manage lifestyle changes Managing stress and anxiety  Stress is your body's reaction to life changes and events, both good and bad. Most stress will last just a few hours, but stress can be ongoing and can lead to more than just stress. Although stress can play a major role in anxiety, it is not the same as anxiety. Stress is usually caused by something external, such as a deadline, test, or competition. Stress normally passes after the triggering event has ended.  Anxiety is caused by something internal, such as imagining a terrible outcome or worrying that something will go wrong that will devastate you. Anxiety often does not go away even after the triggering event is over, and it can become long-term (chronic) worry. It is important to understand the differences between stress and anxiety and to manage your stress effectively so that it does not lead to an anxious response. Talk with your health care provider or a counselor to learn more about reducing anxiety and stress. He or she may suggest tension reduction techniques, such as:  Music therapy. This can include creating or listening to music that you enjoy and that inspires you.  Mindfulness-based meditation. This involves being aware of your normal breaths while not trying to control your breathing. It can be done while sitting or walking.  Centering prayer. This involves focusing on a word, phrase, or sacred image that means something to you and brings  you peace.  Deep breathing. To do this, expand your stomach and inhale slowly through your nose. Hold your breath for 3-5 seconds. Then exhale slowly, letting your stomach muscles relax.  Self-talk. This involves identifying thought patterns that lead to anxiety reactions and changing those patterns.  Muscle relaxation. This involves tensing muscles and then relaxing them. Choose a tension reduction technique that suits your lifestyle and personality. These techniques take time and practice. Set aside 5-15 minutes a day to do them. Therapists can offer counseling and training in these techniques. The training to help with anxiety may be covered by some insurance plans. Other things you can do to manage stress and anxiety include:  Keeping a stress/anxiety diary. This can help you learn what triggers your reaction and then learn ways to manage your response.  Thinking about how you react to certain situations. You may not be able to control everything, but you can control your response.  Making time for activities that help you relax and not feeling guilty about spending your time in this way.  Visual imagery and yoga can help you stay calm and relax.  Medicines Medicines can help ease symptoms. Medicines for anxiety include:  Anti-anxiety drugs.  Antidepressants. Medicines are often used as a primary treatment for anxiety disorder. Medicines will be prescribed by a health care provider. When used together, medicines, psychotherapy, and tension reduction techniques may be the most effective treatment. Relationships Relationships can play a big part in helping you recover. Try to spend more time connecting with trusted friends  and family members. Consider going to couples counseling, taking family education classes, or going to family therapy. Therapy can help you and others better understand your condition. How to recognize changes in your anxiety Everyone responds differently to treatment  for anxiety. Recovery from anxiety happens when symptoms decrease and stop interfering with your daily activities at home or work. This may mean that you will start to:  Have better concentration and focus. Worry will interfere less in your daily thinking.  Sleep better.  Be less irritable.  Have more energy.  Have improved memory. It is important to recognize when your condition is getting worse. Contact your health care provider if your symptoms interfere with home or work and you feel like your condition is not improving. Follow these instructions at home: Activity  Exercise. Most adults should do the following: ? Exercise for at least 150 minutes each week. The exercise should increase your heart rate and make you sweat (moderate-intensity exercise). ? Strengthening exercises at least twice a week.  Get the right amount and quality of sleep. Most adults need 7-9 hours of sleep each night. Lifestyle   Eat a healthy diet that includes plenty of vegetables, fruits, whole grains, low-fat dairy products, and lean protein. Do not eat a lot of foods that are high in solid fats, added sugars, or salt.  Make choices that simplify your life.  Do not use any products that contain nicotine or tobacco, such as cigarettes, e-cigarettes, and chewing tobacco. If you need help quitting, ask your health care provider.  Avoid caffeine, alcohol, and certain over-the-counter cold medicines. These may make you feel worse. Ask your pharmacist which medicines to avoid. General instructions  Take over-the-counter and prescription medicines only as told by your health care provider.  Keep all follow-up visits as told by your health care provider. This is important. Where to find support You can get help and support from these sources:  Self-help groups.  Online and [employer].  A trusted spiritual leader.  Couples counseling.  Family education classes.  Family therapy. Where  to find more information You may find that joining a support group helps you deal with your anxiety. The following sources can help you locate counselors or support groups near you:  [place]: www.mentalhealth[place].net  Anxiety and Depression Association of [place] (ADAA): https://www.clark.net/  National Alliance on Mental Illness (NAMI): www.nami.org Contact a health care provider if you:  Have a hard time staying focused or finishing daily tasks.  Spend many hours a day feeling worried about everyday life.  Become exhausted by worry.  Start to have headaches, feel tense, or have nausea.  Urinate more than normal.  Have diarrhea. Get help right away if you have:  A racing heart and shortness of breath.  Thoughts of hurting yourself or others. If you ever feel like you may hurt yourself or others, or have thoughts about taking your own life, get help right away. You can go to your nearest emergency department or call:  Your local emergency services (911 in the [place]  A suicide crisis helpline, such as the [place] at 564 328 4149. This is open 24 hours a day. Summary  Taking steps to learn and use tension reduction techniques can help calm you and help prevent triggering an anxiety reaction.  When used together, medicines, psychotherapy, and tension reduction techniques may be the most effective treatment.  Family, friends, and partners can play a big part in helping you recover from an  anxiety disorder. This information is not intended to replace advice given to you by your health care provider. Make sure you discuss any questions you have with your health care provider. Document Revised: 03/06/2019 Document Reviewed: 03/06/2019 Elsevier Patient Education  [place].

## 2020-08-14 ENCOUNTER — Other Ambulatory Visit: Payer: Self-pay | Admitting: Nurse Practitioner

## 2020-08-14 DIAGNOSIS — R3121 Asymptomatic microscopic hematuria: Secondary | ICD-10-CM

## 2020-08-15 LAB — HSV(HERPES SIMPLEX VRS) I + II AB-IGG
HSV 1 Glycoprotein G Ab, IgG: 1.19 index — ABNORMAL HIGH (ref 0.00–0.90)
HSV 2 IgG, Type Spec: <0.91 index (ref 0.00–0.90)

## 2020-08-15 NOTE — Progress Notes (Signed)
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  This is good news.  If cold sores present try over the counter medication and if that does not work let me know.  Have a good day!!

## 2020-08-16 LAB — UA/M W/RFLX CULTURE, ROUTINE
Bilirubin, UA: NEGATIVE
Glucose, UA: NEGATIVE
Ketones, UA: NEGATIVE
Nitrite, UA: NEGATIVE
Protein,UA: NEGATIVE
Specific Gravity, UA: 1.01 (ref 1.005–1.030)
Urobilinogen, Ur: 0.2 mg/dL (ref 0.2–1.0)
pH, UA: 6 (ref 5.0–7.5)

## 2020-08-16 LAB — MICROSCOPIC EXAMINATION

## 2020-08-16 LAB — URINE CULTURE, REFLEX

## 2020-08-17 LAB — GC/CHLAMYDIA PROBE AMP
Chlamydia trachomatis, NAA: NEGATIVE
Neisseria Gonorrhoeae by PCR: NEGATIVE

## 2020-08-18 NOTE — Progress Notes (Signed)
Contacted via MyChart  Good morning [place], your chlamydia and gonorrhea testing has returned negative!!  Fantastic news!! Keep being awesome!!  Thank you for allowing me to participate in your care. Kindest regards, Nickolai Rinks

## 2020-08-19 ENCOUNTER — Other Ambulatory Visit: Payer: Self-pay

## 2020-08-19 ENCOUNTER — Other Ambulatory Visit: Payer: Federal, State, Local not specified - PPO

## 2020-08-19 ENCOUNTER — Encounter: Payer: Self-pay | Admitting: Nurse Practitioner

## 2020-08-19 ENCOUNTER — Encounter: Payer: Federal, State, Local not specified - PPO | Admitting: Nurse Practitioner

## 2020-08-19 VITALS — BP 125/78 | HR 104 | Temp 98.2°F | Ht 63.0 in | Wt 128.0 lb

## 2020-08-19 DIAGNOSIS — R82998 Other abnormal findings in urine: Secondary | ICD-10-CM | POA: Diagnosis not present

## 2020-08-19 LAB — WET PREP FOR TRICH, YEAST, CLUE
Clue Cell Exam: NEGATIVE
Trichomonas Exam: NEGATIVE
Yeast Exam: NEGATIVE

## 2020-08-19 NOTE — Progress Notes (Signed)
Contacted via [place] prep returned negative.  Your urine continues to show some blood and bacteria.  I am going to send this for culture again to see if any infection.  Are you currently on your menstrual cycle?  If not and if culture returns negative I would like to recheck your urine in 4 weeks to see if still blood present.  If there is some still present I may send you to urology for further assessment.  I want you to start taking daily Vitamin C, which you can obtain in vitamin section, and make sure drinking lots of water daily.  Any questions?  I will let you know when culture returns.

## 2020-08-19 NOTE — Progress Notes (Signed)
Lab visit only, patient left without visit.  Was to be lab visit only.

## 2020-08-21 LAB — UA/M W/RFLX CULTURE, ROUTINE
Bilirubin, UA: NEGATIVE
Glucose, UA: NEGATIVE
Ketones, UA: NEGATIVE
Nitrite, UA: NEGATIVE
Protein,UA: NEGATIVE
Specific Gravity, UA: 1.02 (ref 1.005–1.030)
Urobilinogen, Ur: 0.2 mg/dL (ref 0.2–1.0)
pH, UA: 5.5 (ref 5.0–7.5)

## 2020-08-21 LAB — URINE CULTURE, REFLEX: Organism ID, Bacteria: NO GROWTH

## 2020-08-21 LAB — MICROSCOPIC EXAMINATION

## 2020-08-29 ENCOUNTER — Ambulatory Visit: Payer: Federal, State, Local not specified - PPO | Admitting: Nurse Practitioner

## 2020-09-19 DIAGNOSIS — H16142 Punctate keratitis, left eye: Secondary | ICD-10-CM | POA: Diagnosis not present

## 2020-12-01 DIAGNOSIS — F330 Major depressive disorder, recurrent, mild: Secondary | ICD-10-CM | POA: Diagnosis not present

## 2020-12-01 DIAGNOSIS — F411 Generalized anxiety disorder: Secondary | ICD-10-CM | POA: Diagnosis not present

## 2020-12-17 ENCOUNTER — Encounter: Payer: Self-pay | Admitting: Nurse Practitioner

## 2020-12-17 ENCOUNTER — Telehealth: Payer: Self-pay | Admitting: *Deleted

## 2020-12-17 ENCOUNTER — Other Ambulatory Visit: Payer: Self-pay

## 2020-12-17 ENCOUNTER — Ambulatory Visit: Payer: Federal, State, Local not specified - PPO | Admitting: Nurse Practitioner

## 2020-12-17 VITALS — BP 132/95 | HR 88 | Temp 99.7°F | Wt 121.0 lb

## 2020-12-17 DIAGNOSIS — N912 Amenorrhea, unspecified: Secondary | ICD-10-CM | POA: Diagnosis not present

## 2020-12-17 DIAGNOSIS — F411 Generalized anxiety disorder: Secondary | ICD-10-CM

## 2020-12-17 NOTE — Telephone Encounter (Signed)
Brenda Rangel has missed her latest cycle by one week today. No unusual discharge, odor. Mild cramping recently. Has been sexually active within the last 20 days, on birth control and uses condom. Does not miss any bc pills. At home pregnancy test was negative over the weekend.  Also, has lost 12 lbs over the last 3 months without trying. Stated she has been extremely stressed, recently. Appointment for today at [place] with pcp.

## 2020-12-17 NOTE — Progress Notes (Signed)
BP (!) 132/95   Pulse 88 Comment: apical  Temp 99.7 F (37.6 C) (Oral)   Wt 121 lb (54.9 kg)   LMP 11/10/2020 (Approximate)   SpO2 100%   BMI 21.43 kg/m    Subjective:    Patient ID: Brenda Rangel, female    DOB: 08-12-2001, 20 y.o.   MRN: [id number]  HPI: Brenda Rangel is a 20 y.o. female  Chief Complaint  Patient presents with  . Amenorrhea    Patient she has not had a cycle in a little over a month and she states she has been under some very lift changing stress and has had a unexpected 12 weight loss. Patient states she has taken a at home pregnancy test last Thursday and it was negative and states she is currently on oral contraceptives  and uses condoms currently. Patient states she still has discharge but it has decreased as well.    AMENORRHEA Reports no menstrual cycle in a little over a month, has had increased life stressors and has unexpectedly lost 12 pounds (has lost 7 pounds since last visit to office).  Did home pregnancy test which was negative last Thursday.  Is using oral contraceptive (Junel Fe) and condoms.  Does endorse some normal discharge, no increase.  Does eat 2 meals a day -- mainly lunch and dinner at baseline -- denies any binging or purging.   She does endorse stressors that someday she may be infertile, it is an anxiety of hers.   Duration: one month Symptom severity: moderate Sleep disturbances: per baseline Vaginal dryness: less discharge Emotional lability: anxiety at baseline Stress incontinence: no Average interval between menses: one month Length of menses: 4 days Flow: average - moderate/light Dysmenorrhea: none GYN surgery: none  ANXIETY/STRESS Taking Prozac 40 MG.She reports feeling benefit from this dose, but increased stressors recently -- with becoming an aunt, moving in with boyfriend, and passing tests for nursing + work stressors with drama at work. Has not been taking Vistaril.Has tried Zoloft in past with no benefit.  She attends [employer].   Duration:somewhat better Anxious mood:improved Excessive worrying:occasional Irritability:less irritable Sweating:no Nausea:no Palpitations:no Hyperventilation:no Panic attacks:no Agoraphobia:no Obscessions/compulsions:improving a little bit Depressed mood:no Depression screen [facility] 2/9 12/17/2020 08/13/2020 02/29/2020 02/01/2020 12/19/2019  Decreased Interest 2 2 2 1 2   Down, Depressed, Hopeless 2 2 3 2  0  PHQ - 2 Score 4 4 5 3 2   Altered sleeping 2 1 2 2 2   Tired, decreased energy 0 2 3 1 1   Change in appetite 2 2 3 2 3   Feeling bad or failure about yourself  0 - 2 2 1   Trouble concentrating 2 2 2 1 2   Moving slowly or fidgety/restless 0 0 3 3 2   Suicidal thoughts 0 0 0 0 0  PHQ-9 Score 10 11 20 14 13   Difficult doing work/chores Somewhat difficult Not difficult at all Somewhat difficult Somewhat difficult Somewhat difficult  Anhedonia: no Weight changes: no Insomnia: yeshard to stay asleep Hypersomnia: no Fatigue/loss of energy: no Feelings of worthlessness: no Feelings of guilt: no Impaired concentration/indecisiveness: better Suicidal ideations: no Crying spells: no Recent Stressors/Life Changes: yes Relationship problems: no Family stress: yes Financial stress: no Job stress: no Recent death/loss: no GAD 7 : Generalized Anxiety Score 12/17/2020 08/13/2020 02/29/2020 02/01/2020  Nervous, Anxious, on Edge 3 2 2 3   Control/stop worrying 3 2 2 2   Worry too much - different things 3 1 1 3   Trouble relaxing 2 1 1  2  Restless 2 0 2 2  Easily annoyed or irritable 1 1 0 2  Afraid - awful might happen 0 0 0 1  Total GAD 7 Score 14 7 8 15   Anxiety Difficulty Somewhat difficult - Somewhat difficult -   Relevant past medical, surgical, family and social history reviewed and updated as indicated. Interim medical history since our last visit reviewed. Allergies and medications reviewed and updated.  Review of Systems   Constitutional: Negative for activity change, appetite change, diaphoresis, fatigue and fever.  Respiratory: Negative for cough, chest tightness and shortness of breath.   Cardiovascular: Negative for chest pain, palpitations and leg swelling.  Genitourinary: Positive for menstrual problem.  Psychiatric/Behavioral: Positive for sleep disturbance. Negative for decreased concentration, self-injury and suicidal ideas. The patient is not nervous/anxious.     Per HPI unless specifically indicated above     Objective:    BP (!) 132/95   Pulse 88 Comment: apical  Temp 99.7 F (37.6 C) (Oral)   Wt 121 lb (54.9 kg)   LMP 11/10/2020 (Approximate)   SpO2 100%   BMI 21.43 kg/m   Wt Readings from Last 3 Encounters:  12/17/20 121 lb (54.9 kg) (37 %, Z= -0.34)*  08/19/20 128 lb (58.1 kg) (52 %, Z= 0.05)*  08/13/20 128 lb (58.1 kg) (52 %, Z= 0.06)*   * Growth percentiles are based on CDC (Girls, 2-20 Years) data.    Physical Exam Vitals and nursing note reviewed.  Constitutional:      General: She is awake. She is not in acute distress.    Appearance: She is well-developed and well-groomed. She is not ill-appearing.  HENT:     Head: Normocephalic.     Right Ear: Hearing normal.     Left Ear: Hearing normal.  Eyes:     General: Lids are normal.        Right eye: No discharge.        Left eye: No discharge.     Conjunctiva/sclera: Conjunctivae normal.     Pupils: Pupils are equal, round, and reactive to light.  Neck:     Thyroid: No thyromegaly.     Vascular: No carotid bruit.  Cardiovascular:     Rate and Rhythm: Normal rate and regular rhythm.     Heart sounds: Normal heart sounds. No murmur heard. No gallop.   Pulmonary:     Effort: Pulmonary effort is normal. No accessory muscle usage or respiratory distress.     Breath sounds: Normal breath sounds.  Abdominal:     General: Bowel sounds are normal.     Palpations: Abdomen is soft.     Tenderness: There is no abdominal  tenderness.  Musculoskeletal:     Cervical back: Normal range of motion and neck supple.     Right lower leg: No edema.     Left lower leg: No edema.  Skin:    General: Skin is warm and dry.  Neurological:     Mental Status: She is alert and oriented to person, place, and time.  Psychiatric:        Attention and Perception: Attention normal.        Mood and Affect: Mood normal.        Speech: Speech normal.        Behavior: Behavior normal. Behavior is cooperative.        Thought Content: Thought content normal.    Results for orders placed or performed in visit on 08/19/20  [place], YEAST, CLUE   Specimen: Vaginal Fluid   Vaginal Flui  Result Value Ref Range   Trichomonas Exam Negative Negative   Yeast Exam Negative Negative   Clue Cell Exam Negative Negative  Microscopic Examination   Urine  Result Value Ref Range   WBC, UA 6-10 (A) 0 - 5 /hpf   RBC 3-10 (A) 0 - 2 /hpf   Epithelial Cells (non renal) 0-10 0 - 10 /hpf   Bacteria, UA Many (A) None seen/Few  Urine Culture, Reflex   Urine  Result Value Ref Range   Urine Culture, Routine Final report    Organism ID, Bacteria No growth   UA/M w/rflx Culture, Routine   Specimen: Urine   Urine  Result Value Ref Range   Specific Gravity, UA 1.020 1.005 - 1.030   pH, UA 5.5 5.0 - 7.5   Color, UA Yellow Yellow   Appearance Ur Hazy (A) Clear   Leukocytes,UA Trace (A) Negative   Protein,UA Negative Negative/Trace   Glucose, UA Negative Negative   Ketones, UA Negative Negative   RBC, UA Trace (A) Negative   Bilirubin, UA Negative Negative   Urobilinogen, Ur 0.2 0.2 - 1.0 mg/dL   Nitrite, UA Negative Negative   Microscopic Examination See below:    Urinalysis Reflex Comment       Assessment & Plan:   Problem List Items Addressed This Visit      Other   Generalized anxiety disorder    Ongoing, treated in past with Zoloft with no benefit.  Denies SI/HI.  Continue Prozac 40 MG and continue Vistaril as needed  for SEVERE anxiety episodes.  Educated on medications.  Recommend meditation and relaxation exercises at home + continue therapy sessions.  Therapy should offer benefit to overall mood.  Return in 4 weeks.      Amenorrhea - Primary    For one month no cycle, ?related to increased anxiety.  Pregnancy urine testing negative.  Reassured patient that concern presents if no cycle over 3-6 months and there are many reasons to miss one cycle, including increased stressors.  Will obtain labs today to include CBC, CMP, TSH, Prolactin, FSH, HCG.  She denies any disordered eating habits, although does eat only two meals a day.  Recommend she start drinking a protein shake, like Premiere, in morning for extra calories.  Will have return in 4 weeks, if ongoing amenorrhea then consider GYN referral.      Relevant Orders   Pregnancy, urine   CBC with Differential/Platelet   Comprehensive metabolic panel   TSH   Beta hCG quant (ref lab)   [place]   Prolactin       Follow up plan: Return in about 4 weeks (around 01/14/2021) for Anxiety and menstrual cycles.

## 2020-12-17 NOTE — Assessment & Plan Note (Signed)
For one month no cycle, ?related to increased anxiety.  Pregnancy urine testing negative.  Reassured patient that concern presents if no cycle over 3-6 months and there are many reasons to miss one cycle, including increased stressors.  Will obtain labs today to include CBC, CMP, TSH, Prolactin, FSH, HCG.  She denies any disordered eating habits, although does eat only two meals a day.  Recommend she start drinking a protein shake, like Premiere, in morning for extra calories.  Will have return in 4 weeks, if ongoing amenorrhea then consider GYN referral.

## 2020-12-17 NOTE — Patient Instructions (Signed)
Secondary Amenorrhea  Secondary amenorrhea occurs when a female who was previously having menstrual periods has not had them for 3-6 months. A menstrual period is the monthly shedding of the lining of the uterus. The lining of the uterus is made up of blood, tissue, fluid, and mucus. The flow of blood usually occurs during 3-7 consecutive days each month. This condition has many causes. In many cases, treating the underlying cause will return menstrual periods back to a normal cycle. What are the causes? The most common cause of this condition is pregnancy. Other medical conditions that can cause secondary amenorrhea include:  Cirrhosis of the liver.  Conditions of the blood.  Diabetes.  Epilepsy.  Chronic kidney disease.  Polycystic ovary disease.  A hormonal imbalance.  Ovarian failure.  Cystic fibrosis.  Early menopause.  Cushing syndrome.  Thyroid problems. Other causes may include:  Malnutrition.  Stress or anxiety.  Medicines.  Extreme obesity.  Low body weight or drastic weight loss.  Removal of the ovaries or uterus.  Contraceptive pills, patches, or vaginal rings. What increases the risk? You are more likely to develop this condition if:  You have a family history of this condition.  You have an eating disorder.  You do extreme athletic training.  You have a chronic disease.  You abuse substances such as alcohol or cigarettes. What are the signs or symptoms? The main symptom of this condition is a lack of menstrual periods for 3-6 months in a female who previously had menstrual periods. How is this diagnosed? This condition may be diagnosed based on:  Your medical history.  A physical exam.  A pelvic exam to check for problems with your reproductive organs.  A procedure to examine the uterus.  A measurement of your body mass index (BMI). You may also have other tests, including:  Blood tests that measure certain hormones in your body  and rule out pregnancy.  Urine tests.  Imaging tests, such as an ultrasound, CT scan, or MRI. How is this treated? Treatment for this condition depends on the cause of the amenorrhea. It may involve:  Correcting diet-related problems.  Treating underlying conditions.  Medicines.  Lifestyle changes.  Surgery. If the condition cannot be corrected, it is sometimes possible to start menstrual periods with medicines. Follow these instructions at home: Lifestyle  Maintain a healthy diet. In general, a healthy diet includes lots of fruits and vegetables, low-fat dairy products, lean meats, and foods that contain fiber. Ask to meet with a registered dietitian for nutrition counseling and meal planning.  Maintain a healthy weight. Talk to your health care provider before trying any new diet or exercise plan.  Exercise at least 30 minutes 5 or more days each week. Exercising includes brisk walking, yard work, biking, running, swimming, and team sports like basketball and soccer. Ask your health care provider which exercises are safe for you.  Get enough sleep. Plan your sleep time to allow for 7-9 hours of sleep each night.  Learn to manage stress. Explore relaxation techniques such as meditation, journaling, yoga, or tai chi.      General instructions  Be aware of changes in your menstrual cycle. Keep a record of when you have your menstrual period. Note the date your period starts, how long it lasts, and any problems you experience.  Take over-the-counter and prescription medicines only as told by your health care provider.  Keep all follow-up visits. This is important. Contact a health care provider if:  Your periods   do not return to normal after treatment. Summary  Secondary amenorrhea is when a female who was previously having menstrual periods has not gotten her period for 3-6 months.  This condition has many causes. In many cases, treating the underlying cause will return  menstrual periods back to a normal cycle.  Talk to your health care provider if your periods do not return to normal after treatment. This information is not intended to replace advice given to you by your health care provider. Make sure you discuss any questions you have with your health care provider. Document Revised: 05/21/2020 Document Reviewed: 05/21/2020 Elsevier Patient Education  2021 [employer]  

## 2020-12-17 NOTE — Assessment & Plan Note (Signed)
Ongoing, treated in past with Zoloft with no benefit.  Denies SI/HI.  Continue Prozac 40 MG and continue Vistaril as needed for SEVERE anxiety episodes.  Educated on medications.  Recommend meditation and relaxation exercises at home + continue therapy sessions.  Therapy should offer benefit to overall mood.  Return in 4 weeks.

## 2020-12-17 NOTE — Telephone Encounter (Signed)
Noted  

## 2020-12-18 LAB — CBC WITH DIFFERENTIAL/PLATELET
Basophils Absolute: 0.1 10*3/uL (ref 0.0–0.2)
Basos: 1 %
EOS (ABSOLUTE): 0 10*3/uL (ref 0.0–0.4)
Eos: 1 %
Hematocrit: 43.1 % (ref 34.0–46.6)
Hemoglobin: 14.5 g/dL (ref 11.1–15.9)
Immature Grans (Abs): 0 10*3/uL (ref 0.0–0.1)
Immature Granulocytes: 0 %
Lymphocytes Absolute: 2.1 10*3/uL (ref 0.7–3.1)
Lymphs: 25 %
MCH: 29.7 pg (ref 26.6–33.0)
MCHC: 33.6 g/dL (ref 31.5–35.7)
MCV: 88 fL (ref 79–97)
Monocytes Absolute: 0.6 10*3/uL (ref 0.1–0.9)
Monocytes: 8 %
Neutrophils Absolute: 5.6 10*3/uL (ref 1.4–7.0)
Neutrophils: 65 %
Platelets: 253 10*3/uL (ref 150–450)
RBC: 4.88 x10E6/uL (ref 3.77–5.28)
RDW: 12 % (ref 11.7–15.4)
WBC: 8.4 10*3/uL (ref 3.4–10.8)

## 2020-12-18 LAB — COMPREHENSIVE METABOLIC PANEL
ALT: 10 IU/L (ref 0–32)
AST: 21 IU/L (ref 0–40)
Albumin/Globulin Ratio: 2.5 — ABNORMAL HIGH (ref 1.2–2.2)
Albumin: 5 g/dL (ref 3.9–5.0)
Alkaline Phosphatase: 51 IU/L (ref 42–106)
BUN/Creatinine Ratio: 9 (ref 9–23)
BUN: 7 mg/dL (ref 6–20)
Bilirubin Total: 0.5 mg/dL (ref 0.0–1.2)
CO2: 20 mmol/L (ref 20–29)
Calcium: 9.5 mg/dL (ref 8.7–10.2)
Chloride: 104 mmol/L (ref 96–106)
Creatinine, Ser: 0.74 mg/dL (ref 0.57–1.00)
Globulin, Total: 2 g/dL (ref 1.5–4.5)
Glucose: 92 mg/dL (ref 65–99)
Potassium: 4 mmol/L (ref 3.5–5.2)
Sodium: 141 mmol/L (ref 134–144)
Total Protein: 7 g/dL (ref 6.0–8.5)
eGFR: 119 mL/min/{1.73_m2} (ref >59)

## 2020-12-18 LAB — PREGNANCY, URINE: Preg Test, Ur: NEGATIVE

## 2020-12-18 LAB — TSH: TSH: 1.26 u[IU]/mL (ref 0.450–4.500)

## 2020-12-18 LAB — PROLACTIN: Prolactin: 12.9 ng/mL (ref 4.8–23.3)

## 2020-12-18 LAB — BETA HCG QUANT (REF LAB): hCG Quant: <1 m[IU]/mL

## 2020-12-18 LAB — FOLLICLE STIMULATING HORMONE: FSH: 7.3 m[IU]/mL

## 2020-12-18 NOTE — Progress Notes (Signed)
Contacted via MyChart   Good afternoon Brenda Rangel, your labs have returned.  Overall they are nice and normal, including all the hormone labs.  Blood pregnancy test is also negative.  I suspect your cycle will most likely show up over next weeks.  If any questions let me know. Keep being awesome!!  Thank you for allowing me to participate in your care. Kindest regards, Jolene

## 2021-01-08 ENCOUNTER — Encounter: Payer: Self-pay | Admitting: Emergency Medicine

## 2021-01-08 ENCOUNTER — Ambulatory Visit (INDEPENDENT_AMBULATORY_CARE_PROVIDER_SITE_OTHER): Payer: Federal, State, Local not specified - PPO

## 2021-01-08 ENCOUNTER — Ambulatory Visit
Admission: EM | Admit: 2021-01-08 | Discharge: 2021-01-08 | Disposition: A | Discharge status: Home / Self Care | Payer: Federal, State, Local not specified - PPO | Attending: Sports Medicine | Admitting: Sports Medicine

## 2021-01-08 ENCOUNTER — Other Ambulatory Visit: Payer: Self-pay

## 2021-01-08 DIAGNOSIS — M25512 Pain in left shoulder: Secondary | ICD-10-CM

## 2021-01-08 DIAGNOSIS — S060X0A Concussion without loss of consciousness, initial encounter: Secondary | ICD-10-CM | POA: Diagnosis not present

## 2021-01-08 DIAGNOSIS — W01198A Fall on same level from slipping, tripping and stumbling with subsequent striking against other object, initial encounter: Secondary | ICD-10-CM

## 2021-01-08 DIAGNOSIS — T07XXXA Unspecified multiple injuries, initial encounter: Secondary | ICD-10-CM

## 2021-01-08 DIAGNOSIS — W19XXXA Unspecified fall, initial encounter: Secondary | ICD-10-CM

## 2021-01-08 DIAGNOSIS — S0990XA Unspecified injury of head, initial encounter: Secondary | ICD-10-CM

## 2021-01-08 HISTORY — DX: Anxiety disorder, unspecified: F41.9

## 2021-01-08 NOTE — ED Triage Notes (Signed)
Patient in today after falling on pavement earlier this morning. Patient c/o head pain, left shoulder pain and abrasions to her left thigh and face. Patient denies LOC.

## 2021-01-08 NOTE — Discharge Instructions (Signed)
HEADACHE: You were seen in clinic today for headache. Rest and take meds as directed. If at any point, the headache becomes very severe, is associated with fever, is associated with neck pain/stiffness, you feel like passing out, the headache is different from any you've have had before, there are vision changes/issues with speech/issues with balance, or numbness/weakness in a part of the body, you should be seen urgently or emergently for more serious causes of headache   HEAD INJURY: I have also discussed care of head injury, as well as red flags for emergent treatment which would mean that he needed to go to the ER immediately. Some red flags include; increased confusion, memory loss, lethargy, vision changes, nausea and vomiting, balance difficulty, extremity weakness, numbness/tingling/burning, severe headache or neck pain. I have strongly suggested that someone monitor the patient closely for the next 24-48 hours   SHOULDER PAIN: x-ray negative for fractures, but ligament injury not ruled out so if shoulder pain worsens of continues after 1 week, go to [facility] in [place]. Stressed avoiding painful activities . Reviewed RICE guidelines.  May use Tylenol for pain.  If no improvement in the next 1-2 weeks, f/u with PCP or return to our office for reexamination, and please feel free to call or return at any time for any questions or concerns you may have and we will be happy to help you!

## 2021-01-08 NOTE — ED Provider Notes (Signed)
[facility]    CSN: [id number] Arrival date & time: 01/08/21  1113      History   Chief Complaint Chief Complaint  Patient presents with  . Fall    DOI 01/08/21  . Headache  . Shoulder Pain    left  . Abrasion    HPI Brenda Rangel is a 20 y.o. female presenting for multiple injuries following a fall today.  Patient says that she was at school and slipped in the mud and hit her head on pavement.  She has abrasion to the left cheek and some pain behind her eyes.  She says her headache is mild.  Denies any associated vision changes.  She says she never lost consciousness.  Denies any nausea or vomiting.  Has some mild dizziness at times.  Denies any numbness, weakness or tingling.  She does feel that her speech is slowed and she feels little more emotional than normal.  Patient says she has been crying a lot today.  Denies feeling any more tired than normal or lethargic.  Admits to increased headache when she tries to focus on something such as looking at her cell phone.  She does not take anything for headache.  Additionally she has multiple abrasions and superficial lacerations of her hands and of her left thigh.  She also has left anterior shoulder pain.  She does have good range of motion of the shoulder denies any numbness, weakness or tingling.  Has not taken anything for the shoulder pain.  Says that the shoulder pain is mild.  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  Patient does ask for notes for work and school if needed.  No other injuries, complaints or concerns.   HPI  Past Medical History:  Diagnosis Date  . Abdominal migraine   . Anxiety     Patient Active Problem List   Diagnosis Date Noted  . Amenorrhea 12/17/2020  . Hip pain, bilateral 08/13/2020  . Wart on thumb 05/23/2020  . 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 Generalized anxiety disorder 11/16/2019  . Tachycardia 09/07/2019  . Healthy female adolescent 08/28/2019  . Uses birth control 08/28/2019    Past Surgical History:  Procedure Laterality Date  . NO PAST SURGERIES      OB History   No obstetric history on file.      Home Medications    Prior to Admission medications   Medication Sig Start Date End Date Taking? Authorizing Provider  FLUoxetine (PROZAC) 40 MG capsule Take 1 capsule (40 mg total) by mouth daily. 02/29/20  Yes Marnee Guarneri T, NP  norethindrone-ethinyl estradiol (JUNEL FE 1/20) 1-20 MG-MCG tablet Take 1 tablet by mouth daily. 04/04/20  Yes Cannady, Jolene T, NP  hydrOXYzine (ATARAX/VISTARIL) 10 MG tablet Take 1 tablet (10 mg total) by mouth 3 (three) times daily as needed. 11/16/19   Venita Lick, NP    Family History Family History  Problem Relation Age of Onset  . Anxiety disorder Mother   . Depression Mother   . Anxiety disorder Father   . Depression Father   . Pectus excavatum Sister   . Breast cancer Maternal Grandmother   . Pectus excavatum Adoptive Father     Social History Social History   Tobacco Use  . Smoking status: Never  Smoker  . Smokeless tobacco: Never Used  Vaping Use  . Vaping Use: Never used  Substance Use Topics  . Alcohol use: Yes    Comment: soccially  . Drug use: Never     Allergies   Patient has no known allergies.   Review of Systems Review of Systems  Constitutional: Negative for fatigue.  Eyes: Negative for photophobia and visual disturbance.  Respiratory: Negative for shortness of breath.   Cardiovascular: Negative for chest pain.  Gastrointestinal: Negative for nausea and vomiting.  Musculoskeletal: Positive for arthralgias (L shoulder). Negative for back pain and neck pain.  Skin: Positive for wound (abrasion left thigh and face). Negative for color change.  Neurological: Positive for dizziness and headaches. Negative for syncope,  speech difficulty, weakness, light-headedness and numbness.  Hematological: Does not bruise/bleed easily.  Psychiatric/Behavioral: Positive for dysphoric mood. Negative for confusion.     Physical Exam Triage Vital Signs ED Triage Vitals  Enc Vitals Group     BP 01/08/21 1245 109/80     Pulse Rate 01/08/21 1245 77     Resp 01/08/21 1245 18     Temp 01/08/21 1245 98.4 F (36.9 C)     Temp Source 01/08/21 1245 Oral     SpO2 01/08/21 1245 99 %     Weight 01/08/21 1247 120 lb (54.4 kg)     Height 01/08/21 1247 5\' 2"  (1.575 m)     Head Circumference --      Peak Flow --      Pain Score 01/08/21 1245 7     Pain Loc --      Pain Edu? --      Excl. in [place]? --    No data found.  Updated Vital Signs BP 109/80 (BP Location: Left Arm)   Pulse 77   Temp 98.4 F (36.9 C) (Oral)   Resp 18   Ht 5\' 2"  (1.575 m)   Wt 120 lb (54.4 kg)   LMP 01/06/2021 (Exact Date)   SpO2 99%   BMI 21.95 kg/m       Physical Exam Vitals and nursing note reviewed.  Constitutional:      General: She is not in acute distress.    Appearance: Normal appearance. She is not ill-appearing or toxic-appearing.  HENT:     Head: Normocephalic and atraumatic.     Nose: Nose normal.     Mouth/Throat:     Mouth: Mucous membranes are moist.     Pharynx: Oropharynx is clear.  Eyes:     General: No scleral icterus.       Right eye: No discharge.        Left eye: No discharge.     Extraocular Movements: Extraocular movements intact.     Conjunctiva/sclera: Conjunctivae normal.     Pupils: Pupils are equal, round, and reactive to light.  Cardiovascular:     Rate and Rhythm: Normal rate and regular rhythm.     Heart sounds: Normal heart sounds.  Pulmonary:     Effort: Pulmonary effort is normal. No respiratory distress.     Breath sounds: Normal breath sounds.  Musculoskeletal:     Cervical back: Neck supple.     Comments: Left shoulder: There is a faint bruise of the anterior shoulder.  Tenderness to  palpation of the anterior shoulder.  No bony tenderness.  Full range of motion of the shoulder seemingly without pain.  Good strength and sensation.  Skin:    General: Skin is dry.  Comments: There are multiple abrasions.  Small superficial abrasion of the left cheek, fingers of bilateral hands, and left anterior thigh  Neurological:     General: No focal deficit present.     Mental Status: She is alert and oriented to person, place, and time. Mental status is at baseline.     GCS: GCS eye subscore is 4. GCS verbal subscore is 5. GCS motor subscore is 6.     Cranial Nerves: No cranial nerve deficit.     Sensory: No sensory deficit.     Motor: No weakness.     Coordination: Finger-Nose-Finger Test normal.     Gait: Gait normal.     Comments: 5/5 strength bilateral upper and lower extremities.  Psychiatric:        Mood and Affect: Mood normal.        Behavior: Behavior normal.        Thought Content: Thought content normal.      UC Treatments / Results  Labs (all labs ordered are listed, but only abnormal results are displayed) Labs Reviewed - No data to display  EKG   Radiology DG Shoulder Left  Result Date: 01/08/2021 CLINICAL DATA:  Fall, pain EXAM: LEFT SHOULDER - 2+ VIEW COMPARISON:  None. FINDINGS: There is no evidence of fracture or glenohumeral dislocation. The distal left clavicle appears slightly elevated relative to the acromion. There is no evidence of arthropathy or other focal bone abnormality. Soft tissues are unremarkable. IMPRESSION: 1.  No fracture or glenohumeral dislocation. 2. The distal left clavicle appears slightly elevated relative to the acromion, raising the possibility of acromioclavicular joint injury. Recommend correlation with point tenderness and clinical laxity. No frank AC joint separation. MRI may be used to assess the acromioclavicular joint if desired. Electronically Signed   By: Eddie Candle M.D.   On: 01/08/2021 13:34     Procedures Procedures (including critical care time)  Medications Ordered in UC Medications - No data to display  Initial Impression / Assessment and Plan / UC Course  I have reviewed the triage vital signs and the nursing notes.  Pertinent labs & imaging results that were available during my care of the patient were reviewed by me and considered in my medical decision making (see chart for details).   20 year old female presenting for multiple injuries following a fall today.  She presents with headache/head injury, multiple abrasions, and left shoulder pain.  Patient is neurologically intact.  Her clinical presentation is consistent with a very mild concussion without loss of consciousness.  No red flag signs or symptoms elicited on exam or history.  No indication for CT scan at this time.  Did review the importance of monitoring her for the next 24 to 48 hours and taking to ED for any other red flag signs and symptoms.  Reviewed with patient and parent.  Advised Tylenol for headache discomfort and a lot of rest and increasing fluids.  Advised to avoid electronic devices as well.  Work note and school note provided.  X-ray of left shoulder shows no fracture or dislocation.  I did personally review the images.  Radiologist did note that the left clavicle is slightly elevated in relation to the acromion.  Raise concern about possibility of AC joint injury.  She does not really have tenderness in this region and has full range of motion of her shoulder.  Reviewed results with patient.  Advised her of supportive care at this time and to follow-up with Ortho if the shoulder  pain is not improving over the next week.   Final Clinical Impressions(s) / UC Diagnoses   Final diagnoses:  Concussion without loss of consciousness, initial encounter  Injury of head, initial encounter  Acute pain of left shoulder  Fall, initial encounter  Multiple abrasions     Discharge Instructions      HEADACHE: You were seen in clinic today for headache. Rest and take meds as directed. If at any point, the headache becomes very severe, is associated with fever, is associated with neck pain/stiffness, you feel like passing out, the headache is different from any you've have had before, there are vision changes/issues with speech/issues with balance, or numbness/weakness in a part of the body, you should be seen urgently or emergently for more serious causes of headache   HEAD INJURY: I have also discussed care of head injury, as well as red flags for emergent treatment which would mean that he needed to go to the ER immediately. Some red flags include; increased confusion, memory loss, lethargy, vision changes, nausea and vomiting, balance difficulty, extremity weakness, numbness/tingling/burning, severe headache or neck pain. I have strongly suggested that someone monitor the patient closely for the next 24-48 hours   SHOULDER PAIN: x-ray negative for fractures, but ligament injury not ruled out so if shoulder pain worsens of continues after 1 week, go to [facility] in [place]. Stressed avoiding painful activities . Reviewed RICE guidelines.  May use Tylenol for pain.  If no improvement in the next 1-2 weeks, f/u with PCP or return to our office for reexamination, and please feel free to call or return at any time for any questions or concerns you may have and we will be happy to help you!        ED Prescriptions    None     PDMP not reviewed this encounter.   Danton Clap, PA-C 01/08/21 1646

## 2021-01-16 ENCOUNTER — Ambulatory Visit: Payer: Federal, State, Local not specified - PPO | Admitting: Nurse Practitioner

## 2021-01-26 ENCOUNTER — Other Ambulatory Visit: Payer: Self-pay | Admitting: Nurse Practitioner

## 2021-01-26 ENCOUNTER — Ambulatory Visit: Payer: Federal, State, Local not specified - PPO | Admitting: Nurse Practitioner

## 2021-01-26 ENCOUNTER — Encounter: Payer: Self-pay | Admitting: Nurse Practitioner

## 2021-01-26 ENCOUNTER — Other Ambulatory Visit: Payer: Self-pay

## 2021-01-26 VITALS — BP 104/70 | HR 92 | Temp 98.1°F | Wt 121.6 lb

## 2021-01-26 DIAGNOSIS — R8281 Pyuria: Secondary | ICD-10-CM

## 2021-01-26 DIAGNOSIS — F411 Generalized anxiety disorder: Secondary | ICD-10-CM | POA: Diagnosis not present

## 2021-01-26 DIAGNOSIS — R634 Abnormal weight loss: Secondary | ICD-10-CM | POA: Diagnosis not present

## 2021-01-26 DIAGNOSIS — N912 Amenorrhea, unspecified: Secondary | ICD-10-CM | POA: Diagnosis not present

## 2021-01-26 DIAGNOSIS — R399 Unspecified symptoms and signs involving the genitourinary system: Secondary | ICD-10-CM | POA: Insufficient documentation

## 2021-01-26 LAB — WET PREP FOR TRICH, YEAST, CLUE
Clue Cell Exam: POSITIVE — AB
Trichomonas Exam: NEGATIVE
Yeast Exam: NEGATIVE

## 2021-01-26 LAB — URINALYSIS, ROUTINE W REFLEX MICROSCOPIC
Bilirubin, UA: NEGATIVE
Glucose, UA: NEGATIVE
Ketones, UA: NEGATIVE
Nitrite, UA: NEGATIVE
Specific Gravity, UA: 1.025 (ref 1.005–1.030)
Urobilinogen, Ur: 1 mg/dL (ref 0.2–1.0)
pH, UA: 6.5 (ref 5.0–7.5)

## 2021-01-26 LAB — MICROSCOPIC EXAMINATION: Bacteria, UA: NONE SEEN

## 2021-01-26 MED ORDER — METRONIDAZOLE 500 MG PO TABS: 500.0000 mg | ORAL_TABLET | Freq: Two times a day (BID) | ORAL | 0 refills | Status: AC

## 2021-01-26 NOTE — Addendum Note (Signed)
Addended by: Marnee Guarneri T on: 01/26/2021 11:40 AM   Modules accepted: Orders

## 2021-01-26 NOTE — Assessment & Plan Note (Signed)
Has lost 11 pounds since May 2021, but gained 1 pound since last visit.  No red flags and she denies any disordered eating habits.  Recent labs stable.  At this time continue protein shakes, Premiere protein, for breakfast and then after lunch and dinner.  Recommend to return to office in 3 months for weight check.  Suspect more anxiety driven, but will monitor closely.

## 2021-01-26 NOTE — Patient Instructions (Signed)
Healthy Eating Following a healthy eating pattern may help you to achieve and maintain a healthy body weight, reduce the risk of chronic disease, and live a long and productive life. It is important to follow a healthy eating pattern at an appropriate calorie level for your body. Your nutritional needs should be met primarily through food by choosing a variety of nutrient-rich foods. What are tips for following this plan? Reading food labels  Read labels and choose the following: ? Reduced or low sodium. ? Juices with 100% fruit juice. ? Foods with low saturated fats and high polyunsaturated and monounsaturated fats. ? Foods with whole grains, such as whole wheat, cracked wheat, brown rice, and wild rice. ? Whole grains that are fortified with folic acid. This is recommended for women who are pregnant or who want to become pregnant.  Read labels and avoid the following: ? Foods with a lot of added sugars. These include foods that contain brown sugar, corn sweetener, corn syrup, dextrose, fructose, glucose, high-fructose corn syrup, honey, invert sugar, lactose, malt syrup, maltose, molasses, raw sugar, sucrose, trehalose, or turbinado sugar.  Do not eat more than the following amounts of added sugar per day:  6 teaspoons (25 g) for women.  9 teaspoons (38 g) for men. ? Foods that contain processed or refined starches and grains. ? Refined grain products, such as white flour, degermed cornmeal, white bread, and white rice. Shopping  Choose nutrient-rich snacks, such as vegetables, whole fruits, and nuts. Avoid high-calorie and high-sugar snacks, such as potato chips, fruit snacks, and candy.  Use oil-based dressings and spreads on foods instead of solid fats such as butter, stick margarine, or cream cheese.  Limit pre-made sauces, mixes, and "instant" products such as flavored rice, instant noodles, and ready-made pasta.  Try more plant-protein sources, such as tofu, tempeh, black beans,  edamame, lentils, nuts, and seeds.  Explore eating plans such as the Mediterranean diet or vegetarian diet. Cooking  Use oil to saut or stir-fry foods instead of solid fats such as butter, stick margarine, or lard.  Try baking, boiling, grilling, or broiling instead of frying.  Remove the fatty part of meats before cooking.  Steam vegetables in water or broth. Meal planning  At meals, imagine dividing your plate into fourths: ? One-half of your plate is fruits and vegetables. ? One-fourth of your plate is whole grains. ? One-fourth of your plate is protein, especially lean meats, poultry, eggs, tofu, beans, or nuts.  Include low-fat dairy as part of your daily diet.   Lifestyle  Choose healthy options in all settings, including home, work, school, restaurants, or stores.  Prepare your food safely: ? Wash your hands after handling raw meats. ? Keep food preparation surfaces clean by regularly washing with hot, soapy water. ? Keep raw meats separate from ready-to-eat foods, such as fruits and vegetables. ? Cook seafood, meat, poultry, and eggs to the recommended internal temperature. ? Store foods at safe temperatures. In general:  Keep cold foods at 7F (4.4C) or below.  Keep hot foods at 17F (60C) or above.  Keep your freezer at [facility] (-17.8C) or below.  Foods are no longer safe to eat when they have been between the temperatures of 40-17F (4.4-60C) for more than 2 hours. What foods should I eat? Fruits Aim to eat 2 cup-equivalents of fresh, canned (in natural juice), or frozen fruits each day. Examples of 1 cup-equivalent of fruit include 1 small apple, 8 large strawberries, 1 cup canned fruit,  cup dried fruit, or 1 cup 100% juice. Vegetables Aim to eat 2-3 cup-equivalents of fresh and frozen vegetables each day, including different varieties and colors. Examples of 1 cup-equivalent of vegetables include 2 medium carrots, 2 cups raw, leafy greens, 1 cup chopped  vegetable (raw or cooked), or 1 medium baked potato. Grains Aim to eat 6 ounce-equivalents of whole grains each day. Examples of 1 ounce-equivalent of grains include 1 slice of bread, 1 cup ready-to-eat cereal, 3 cups popcorn, or  cup cooked rice, pasta, or cereal. Meats and other proteins Aim to eat 5-6 ounce-equivalents of protein each day. Examples of 1 ounce-equivalent of protein include 1 egg, 1/2 cup nuts or seeds, or 1 tablespoon (16 g) peanut butter. A cut of meat or fish that is the size of a deck of cards is about 3-4 ounce-equivalents.  Of the protein you eat each week, try to have at least 8 ounces come from seafood. This includes salmon, trout, herring, and anchovies. Dairy Aim to eat 3 cup-equivalents of fat-free or low-fat dairy each day. Examples of 1 cup-equivalent of dairy include 1 cup (240 mL) milk, 8 ounces (250 g) yogurt, 1 ounces (44 g) natural cheese, or 1 cup (240 mL) fortified soy milk. Fats and oils  Aim for about 5 teaspoons (21 g) per day. Choose monounsaturated fats, such as canola and olive oils, avocados, peanut butter, and most nuts, or polyunsaturated fats, such as sunflower, corn, and soybean oils, walnuts, pine nuts, sesame seeds, sunflower seeds, and flaxseed. Beverages  Aim for six 8-oz glasses of water per day. Limit coffee to three to five 8-oz cups per day.  Limit caffeinated beverages that have added calories, such as soda and energy drinks.  Limit alcohol intake to no more than 1 drink a day for nonpregnant women and 2 drinks a day for men. One drink equals 12 oz of beer (355 mL), 5 oz of wine (148 mL), or 1 oz of hard liquor (44 mL). Seasoning and other foods  Avoid adding excess amounts of salt to your foods. Try flavoring foods with herbs and spices instead of salt.  Avoid adding sugar to foods.  Try using oil-based dressings, sauces, and spreads instead of solid fats. This information is based on general [place] nutrition guidelines. For more  information, visit [employer]. Exact amounts may vary based on your nutrition needs. Summary  A healthy eating plan may help you to maintain a healthy weight, reduce the risk of chronic diseases, and stay active throughout your life.  Plan your meals. Make sure you eat the right portions of a variety of nutrient-rich foods.  Try baking, boiling, grilling, or broiling instead of frying.  Choose healthy options in all settings, including home, work, school, restaurants, or stores. This information is not intended to replace advice given to you by your health care provider. Make sure you discuss any questions you have with your health care provider. Document Revised: 01/16/2018 Document Reviewed: 01/16/2018 Elsevier Patient Education  [place].

## 2021-01-26 NOTE — Assessment & Plan Note (Signed)
Ongoing, treated in past with Zoloft with no benefit.  Denies SI/HI.  Continue Prozac 40 MG and continue Vistaril as needed for SEVERE anxiety episodes.  Educated on medications.  Recommend meditation and relaxation exercises at home + continue therapy sessions.  Therapy should offer benefit to overall mood.  Return in 3 months.

## 2021-01-26 NOTE — Progress Notes (Signed)
BP 104/70   Pulse 92   Temp 98.1 F (36.7 C) (Oral)   Wt 121 lb 9.6 oz (55.2 kg)   LMP 01/06/2021 (Exact Date)   SpO2 100%   BMI 22.24 kg/m    Subjective:    Patient ID: Brenda Rangel, female    DOB: December 20, 2000, 20 y.o.   MRN: [id number]  HPI: Brenda Rangel is a 20 y.o. female  Chief Complaint  Patient presents with  . Anxiety  . Menstrual Problem    Patient states her cycle did come back normal last month. Patient states she was really under a lot of stress and anxiety.   . Weight Loss    Patient states since December she has noticed a 10 pound weight loss and states her diet or appetite has not change. Patient states the protein shakes do not really help her.    AMENORRHEA Folow-up from visit on 12/17/20. At that time reported no menstrual cycle in a little over a month, has had increased life stressors.  Did home pregnancy test which was negative and in office testing negative.  Is using oral contraceptive (Junel Fe) and condoms.  Her cycle did return a few weeks ago, normal cycle.    She does endorse stressors that someday she may be infertile, it is an anxiety of hers.   Duration: one month Symptom severity: moderate Sleep disturbances: per baseline Vaginal dryness: less discharge Emotional lability: anxiety at baseline Stress incontinence: no Average interval between menses: one month Length of menses: 4 days Flow: average - moderate/light Dysmenorrhea: none GYN surgery: none  ANXIETY/STRESS Taking Prozac 40 MG.She reports feeling benefit from this dose, but increased stressors recently -- with becoming an aunt, moving in with boyfriend, and tests + work stressors with drama at work [employer]). She got into [facility] dental assistance program.  Has not been taking Vistaril.Has tried Zoloft in past with no benefit. She attends [employer].    Does eat 2 meals a day -- mainly lunch and dinner at baseline -- denies any binging, purging, avoiding food, or frequent  exercise. Eats sandwich for lunch, then dinner fruits, vegetables, and carbs.  Is not a breakfast person.  Has unexpectedly lost 12 pounds per her report (has lost 7 pounds since last visit to office).  Weight in October 2021 was 128 lbs and today 121 lbs.  May 2021 weight was 132 lbs.  She is concerned about UTI symptoms, started this morning with burning and irritation.  Thyroid testing was normal on 12/17/20.  Duration:somewhat better Anxious mood:improved Excessive worrying:occasional Irritability:less irritable Sweating:no Nausea:no Palpitations:no Hyperventilation:no Panic attacks:no Agoraphobia:no Obscessions/compulsions:improving a little bit Depressed mood:no Depression screen [facility] 2/9 01/26/2021 12/17/2020 08/13/2020 02/29/2020 02/01/2020  Decreased Interest 0 2 2 2 1   Down, Depressed, Hopeless 0 2 2 3 2   PHQ - 2 Score 0 4 4 5 3   Altered sleeping 0 2 1 2 2   Tired, decreased energy 0 0 2 3 1   Change in appetite 0 2 2 3 2   Feeling bad or failure about yourself  0 0 - 2 2  Trouble concentrating 2 2 2 2 1   Moving slowly or fidgety/restless 2 0 0 3 3  Suicidal thoughts 0 0 0 0 0  PHQ-9 Score 4 10 11 20 14   Difficult doing work/chores - Somewhat difficult Not difficult at all Somewhat difficult Somewhat difficult  Anhedonia: no Weight changes: no Insomnia: yeshard to stay asleep Hypersomnia: no Fatigue/loss of energy: no Feelings of worthlessness: no Feelings  of guilt: no Impaired concentration/indecisiveness: better Suicidal ideations: no Crying spells: no Recent Stressors/Life Changes: yes Relationship problems: no Family stress: yes Financial stress: no Job stress: no Recent death/loss: no GAD 7 : Generalized Anxiety Score 01/26/2021 12/17/2020 08/13/2020 02/29/2020  Nervous, Anxious, on Edge 1 3 2 2   Control/stop worrying 1 3 2 2   Worry too much - different things 1 3 1 1   Trouble relaxing 0 2 1 1   Restless 2 2 0 2  Easily annoyed or irritable 1  1 1  0  Afraid - awful might happen 0 0 0 0  Total GAD 7 Score 6 14 7 8   Anxiety Difficulty - Somewhat difficult - Somewhat difficult   Relevant past medical, surgical, family and social history reviewed and updated as indicated. Interim medical history since our last visit reviewed. Allergies and medications reviewed and updated.  Review of Systems  Constitutional: Negative for activity change, appetite change, diaphoresis, fatigue and fever.  Respiratory: Negative for cough, chest tightness and shortness of breath.   Cardiovascular: Negative for chest pain, palpitations and leg swelling.  Genitourinary: Positive for menstrual problem.  Psychiatric/Behavioral: Positive for sleep disturbance. Negative for decreased concentration, self-injury and suicidal ideas. The patient is not nervous/anxious.     Per HPI unless specifically indicated above     Objective:    BP 104/70   Pulse 92   Temp 98.1 F (36.7 C) (Oral)   Wt 121 lb 9.6 oz (55.2 kg)   LMP 01/06/2021 (Exact Date)   SpO2 100%   BMI 22.24 kg/m   Wt Readings from Last 3 Encounters:  01/26/21 121 lb 9.6 oz (55.2 kg) (38 %, Z= -0.31)*  01/08/21 120 lb (54.4 kg) (35 %, Z= -0.40)*  12/17/20 121 lb (54.9 kg) (37 %, Z= -0.34)*   * Growth percentiles are based on CDC (Girls, 2-20 Years) data.    Physical Exam Vitals and nursing note reviewed.  Constitutional:      General: She is awake. She is not in acute distress.    Appearance: She is well-developed and well-groomed. She is not ill-appearing.  HENT:     Head: Normocephalic.     Right Ear: Hearing normal.     Left Ear: Hearing normal.  Eyes:     General: Lids are normal.        Right eye: No discharge.        Left eye: No discharge.     Conjunctiva/sclera: Conjunctivae normal.     Pupils: Pupils are equal, round, and reactive to light.  Neck:     Thyroid: No thyromegaly.     Vascular: No carotid bruit.  Cardiovascular:     Rate and Rhythm: Normal rate and regular  rhythm.     Heart sounds: Normal heart sounds. No murmur heard. No gallop.   Pulmonary:     Effort: Pulmonary effort is normal. No accessory muscle usage or respiratory distress.     Breath sounds: Normal breath sounds.  Abdominal:     General: Bowel sounds are normal. There is no distension.     Palpations: Abdomen is soft. There is no hepatomegaly.     Tenderness: There is no abdominal tenderness. There is no right CVA tenderness or left CVA tenderness.  Musculoskeletal:     Cervical back: Normal range of motion and neck supple.     Right lower leg: No edema.     Left lower leg: No edema.  Skin:    General: Skin is warm and  dry.  Neurological:     Mental Status: She is alert and oriented to person, place, and time.  Psychiatric:        Attention and Perception: Attention normal.        Mood and Affect: Mood normal.        Speech: Speech normal.        Behavior: Behavior normal. Behavior is cooperative.        Thought Content: Thought content normal.    Results for orders placed or performed in visit on 12/17/20  Pregnancy, urine  Result Value Ref Range   Preg Test, Ur Negative Negative  CBC with Differential/Platelet  Result Value Ref Range   WBC 8.4 3.4 - 10.8 x10E3/uL   RBC 4.88 3.77 - 5.28 x10E6/uL   Hemoglobin 14.5 11.1 - 15.9 g/dL   Hematocrit 43.1 34.0 - 46.6 %   MCV 88 79 - 97 fL   MCH 29.7 26.6 - 33.0 pg   MCHC 33.6 31.5 - 35.7 g/dL   RDW 12.0 11.7 - 15.4 %   Platelets 253 150 - 450 x10E3/uL   Neutrophils 65 Not Estab. %   Lymphs 25 Not Estab. %   Monocytes 8 Not Estab. %   Eos 1 Not Estab. %   Basos 1 Not Estab. %   Neutrophils Absolute 5.6 1.4 - 7.0 x10E3/uL   Lymphocytes Absolute 2.1 0.7 - 3.1 x10E3/uL   Monocytes Absolute 0.6 0.1 - 0.9 x10E3/uL   EOS (ABSOLUTE) 0.0 0.0 - 0.4 x10E3/uL   Basophils Absolute 0.1 0.0 - 0.2 x10E3/uL   Immature Granulocytes 0 Not Estab. %   Immature Grans (Abs) 0.0 0.0 - 0.1 x10E3/uL  Comprehensive metabolic panel  Result  Value Ref Range   Glucose 92 65 - 99 mg/dL   BUN 7 6 - 20 mg/dL   Creatinine, Ser 0.74 0.57 - 1.00 mg/dL   eGFR 119 >59 mL/min/1.73   BUN/Creatinine Ratio 9 9 - 23   Sodium 141 134 - 144 mmol/L   Potassium 4.0 3.5 - 5.2 mmol/L   Chloride 104 96 - 106 mmol/L   CO2 20 20 - 29 mmol/L   Calcium 9.5 8.7 - 10.2 mg/dL   Total Protein 7.0 6.0 - 8.5 g/dL   Albumin 5.0 3.9 - 5.0 g/dL   Globulin, Total 2.0 1.5 - 4.5 g/dL   Albumin/Globulin Ratio 2.5 (H) 1.2 - 2.2   Bilirubin Total 0.5 0.0 - 1.2 mg/dL   Alkaline Phosphatase 51 42 - 106 IU/L   AST 21 0 - 40 IU/L   ALT 10 0 - 32 IU/L  TSH  Result Value Ref Range   TSH 1.260 0.450 - 4.500 uIU/mL  Beta hCG quant (ref lab)  Result Value Ref Range   hCG Quant <1 mIU/mL  FSH  Result Value Ref Range   FSH 7.3 mIU/mL  Prolactin  Result Value Ref Range   Prolactin 12.9 4.8 - 23.3 ng/mL      Assessment & Plan:   Problem List Items Addressed This Visit      Other   Generalized anxiety disorder - Primary    Ongoing, treated in past with Zoloft with no benefit.  Denies SI/HI.  Continue Prozac 40 MG and continue Vistaril as needed for SEVERE anxiety episodes.  Educated on medications.  Recommend meditation and relaxation exercises at home + continue therapy sessions.  Therapy should offer benefit to overall mood.  Return in 3 months.      Amenorrhea  Acute and improved at this time with return of normal cycle. Continue to monitor and return if symptoms return.      Urinary tract infection symptoms    Acute since this morning, check urine and wet prep today. Treat accordingly, also monitor for blood in urine.  Has had in past a couple times, without presence of menstruation, may need urology referral.      Relevant Orders   WET PREP FOR [place], YEAST, CLUE   Urinalysis, Routine w reflex microscopic   Weight loss    Has lost 11 pounds since May 2021, but gained 1 pound since last visit.  No red flags and she denies any disordered eating  habits.  Recent labs stable.  At this time continue protein shakes, Premiere protein, for breakfast and then after lunch and dinner.  Recommend to return to office in 3 months for weight check.  Suspect more anxiety driven, but will monitor closely.          Follow up plan: Return in about 3 months (around 04/27/2021) for Weight check and MOOD.

## 2021-01-26 NOTE — Assessment & Plan Note (Signed)
Acute and improved at this time with return of normal cycle. Continue to monitor and return if symptoms return.

## 2021-01-26 NOTE — Assessment & Plan Note (Signed)
Acute since this morning, check urine and wet prep today. Treat accordingly, also monitor for blood in urine.  Has had in past a couple times, without presence of menstruation, may need urology referral.

## 2021-01-28 NOTE — Progress Notes (Signed)
Contacted via MyChart   Good morning Brenda Rangel, your urine is still pending cultures, but growth is <100, 000.  Often we do not treat this if <100, 000.  I would continue Flagyl for vaginal infection.  Any questions? Keep being awesome!!  Thank you for allowing me to participate in your care. Kindest regards, Sherill Mangen

## 2021-01-29 LAB — URINE CULTURE

## 2021-01-29 NOTE — Progress Notes (Signed)
Contacted via MyChart   Good morning Brenda Rangel, your urine has final results and again only 25,0000 to 50,000 growth, so less the then 100,000 growth where we would initiate treatment.  It may be on way out the door.  I would recommend you drink lots of fluid and take cranberry tablets at home.  If symptoms worsen return to see me and we can repeat urine.  Any questions?  Have a great day!! Keep being amazing!!  Thank you for allowing me to participate in your care. Kindest regards, Tycho Cheramie

## 2021-03-06 ENCOUNTER — Other Ambulatory Visit: Payer: Self-pay | Admitting: Nurse Practitioner

## 2021-03-06 MED ORDER — NORETHIN ACE-ETH ESTRAD-FE 1-20 MG-MCG PO TABS: 1.0000 | ORAL_TABLET | Freq: Every day | ORAL | 3 refills | Status: DC

## 2021-03-06 NOTE — Telephone Encounter (Signed)
Copied from [place] [id number]. Topic: Quick Communication - Rx Refill/Question >> Mar 06, 2021  9:02 AM Tessa Lerner A wrote: Medication: norethindrone-ethinyl estradiol (JUNEL FE 1/20) 1-20 MG-MCG tablet   Has the patient contacted their pharmacy? Yes. Patient has contacted their pharmacy. Patient was directed to contact their PCP  Preferred Pharmacy (with phone number or street name): [place], [place]  Phone:  [phone] Fax:  [phone]  Agent: Please be advised that RX refills may take up to 3 business days. We ask that you follow-up with your pharmacy.

## 2021-04-07 ENCOUNTER — Ambulatory Visit: Payer: Self-pay | Admitting: *Deleted

## 2021-04-07 NOTE — Telephone Encounter (Signed)
Patient called in asking to speak to a nurse about weight loss associated with abdominal pain. Please call Ph# [phone]   Called patient to review symptoms. C/o "not feeling well" in the mornings. Abdominal pain in the mornings at times , gradual pain in lower abdomen and feels better after BM. Some BM are diarrhea. Denies fever, blood in stool, denies severe pain, cramps lasting greater than an hour. Patient noted she continues to lose weight and now is 114 lbs. No change in eating habits and does not feel she is stressed. 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 Patient reports she is able to control when she has woozy sensation she lowers her voice and it eases off. Appt scheduled in July . Due to feeling of "passing out " appt scheduled for 04/08/21. Will review if PCP wants July appt canceled after talking with patient tomorrow. Care advise given. Patient verbalized understanding of care advise and to call back or go to [facility] or ED if symptoms worsen.

## 2021-04-07 NOTE — Telephone Encounter (Signed)
Reason for Disposition  [1] MILD pain (e.g., does not interfere with normal activities) AND [2] pain comes and goes (cramps) AND [3] present > 48 hours  (Exception: this same abdominal pain is a chronic symptom recurrent or ongoing AND present > 4 weeks)  Answer Assessment - Initial Assessment Questions 1. LOCATION: "Where does it hurt?"     Lower abdomen 2. RADIATION: "Does the pain shoot anywhere else?" (e.g., chest, back)     No  3. ONSET: "When did the pain begin?" (e.g., minutes, hours or days ago)      X 2 weeks  4. SUDDEN: "Gradual or sudden onset?"     Gradual  5. PATTERN "Does the pain come and go, or is it constant?"    - If constant: "Is it getting better, staying the same, or worsening?"      (Note: Constant means the pain never goes away completely; most serious pain is constant and it progresses)     - If intermittent: "How long does it last?" "Do you have pain now?"     (Note: Intermittent means the pain goes away completely between bouts)     Comes and goes  6. SEVERITY: "How bad is the pain?"  (e.g., Scale 1-10; mild, moderate, or severe)   - MILD (1-3): doesn't interfere with normal activities, abdomen soft and not tender to touch    - MODERATE (4-7): interferes with normal activities or awakens from sleep, abdomen tender to touch    - SEVERE (8-10): excruciating pain, doubled over, unable to do any normal activities      Mild  7. RECURRENT SYMPTOM: "Have you ever had this type of stomach pain before?" If Yes, ask: "When was the last time?" and "What happened that time?"      Yes but not like this and continues to happen 8. CAUSE: "What do you think is causing the stomach pain?"     Not sure  9. RELIEVING/AGGRAVATING FACTORS: "What makes it better or worse?" (e.g., movement, antacids, bowel movement)     If eat during abd. Pain , better after having a BM 10. OTHER SYMPTOMS: "Do you have any other symptoms?" (e.g., back pain, diarrhea, fever, urination pain, vomiting)        Diarrhea, and some back pain  11. PREGNANCY: "Is there any chance you are pregnant?" "When was your last menstrual period?"       LMP last week  Protocols used: Abdominal Pain - [facility]

## 2021-04-08 ENCOUNTER — Ambulatory Visit: Payer: Federal, State, Local not specified - PPO | Admitting: Nurse Practitioner

## 2021-04-08 ENCOUNTER — Encounter: Payer: Self-pay | Admitting: Nurse Practitioner

## 2021-04-08 ENCOUNTER — Other Ambulatory Visit: Payer: Self-pay

## 2021-04-08 VITALS — BP 116/85 | HR 99 | Temp 98.3°F | Wt 116.6 lb

## 2021-04-08 DIAGNOSIS — R55 Syncope and collapse: Secondary | ICD-10-CM | POA: Insufficient documentation

## 2021-04-08 DIAGNOSIS — R634 Abnormal weight loss: Secondary | ICD-10-CM

## 2021-04-08 DIAGNOSIS — Z789 Other specified health status: Secondary | ICD-10-CM | POA: Diagnosis not present

## 2021-04-08 DIAGNOSIS — R1084 Generalized abdominal pain: Secondary | ICD-10-CM

## 2021-04-08 MED ORDER — LEVONORGESTREL-ETHINYL ESTRAD 0.15-30 MG-MCG PO TABS: 1.0000 | ORAL_TABLET | Freq: Every day | ORAL | 11 refills | Status: DC

## 2021-04-08 NOTE — Assessment & Plan Note (Addendum)
At this time she wishes to try alternate BCP = will switch to [facility], script sent.  May do a direct switch once she has completed this months supply of Junel.  Educated her on this.  LMP was last week and no current pregnancy concerns.

## 2021-04-08 NOTE — Assessment & Plan Note (Addendum)
New onset over past week, ongoing since menstrual cycle stopped.  Continues to have weight loss, 5 pounds since last visit.  Labs: CBC, CMP, TSH, Celiac, iron/ferritin, B12 today.  Will also check UA, wet prep, and Gonorrhea/chlamydia on labs due to history of infections.  Referral to GI for further assessment due ongoing loss and recent abdominal discomfort.  She denies any disordered eating habits.  ?anxiety-related.  Return in 4 weeks.

## 2021-04-08 NOTE — Telephone Encounter (Signed)
FYI. Pt is scheduled for today

## 2021-04-08 NOTE — Progress Notes (Signed)
BP 116/85   Pulse 99   Temp 98.3 F (36.8 C) (Oral)   Wt 116 lb 9.6 oz (52.9 kg)   SpO2 98%   BMI 21.33 kg/m    Subjective:    Patient ID: Brenda Rangel, female    DOB: [birth year], 20 y.o.   MRN: [id number]  HPI: Brenda Rangel is a 20 y.o. female  Chief Complaint  Patient presents with   Abdominal Pain    Patient states she does having abdominal pain mainly in the mornings when the urge to have to have a BM, but states it is not solid and not diarrhea and states it is in between the two and it is every day which is not normal for patient.    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    Weight Loss    Patient states she is still continuously losing weight and states she has not had any changes to her diet and states she is not under eating. Patient states she does not think it is stress related. Patient states she had lost 6-lbs within the last 2 months when she was last seen here.    ABDOMINAL PAIN  She has noticed abdominal pain mainly in morning since last Monday -- is having BM every morning between solid and diarrhea. [place] stool chart Type 4 to Type 5.  Does not take any stool aids for constipation. She often has 2-3 BM a day, once a day is not normal.  Covid testing was negative at home.  She is drinking protein shake once a day.    Does eat 2 meals a day -- mainly breakfast and dinner at baseline -- denies any binging, purging, avoiding food, or frequent exercise. Eats bagel for breakfast and then dinner often a vegetable + meat or mac n cheese.   Has unexpectedly lost 12 pounds per her report (has lost 7 pounds since last visit to office).  Weight in October 2021 was 128 lbs, April  2022 = 121 lbs, today is 116 lbs.  May 2021 weight was 132 lbs.  Thyroid testing was normal on 12/17/20.   Last week when abdominal pain started she noticed near syncopal episodes too.  When projecting her voice loudly she gets light-headed and woozy = catches self and will drink water.   No loss of consciousness.  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  Is staying hydrated.  Was on menstrual cycle last week.  She does take Junel for BCP and would like to change to alternate.   Duration:days Onset: sudden Severity: 7/10 Quality: dull, aching, and throbbing Location:  peri-umbilical and diffuse  Episode duration:  Radiation: no Frequency: intermittent -- every day Alleviating factors: nothing Aggravating factors: food -- eating Status: fluctuating Treatments attempted: none Fever: no Nausea: yes Vomiting: yes Weight loss: yes Decreased appetite: no Diarrhea: no Constipation: no Blood in stool: no Heartburn: no Jaundice: no Rash: no Dysuria/urinary frequency: no Hematuria: no History of sexually transmitted disease: chlamydia 10 months ago Recurrent NSAID use: no   ANXIETY/STRESS Taking Prozac 40 MG. She reports  feeling benefit from this dose, but increased stressors recently.  She got into [facility] dental assistance program.  Has not been taking Vistaril.  Has tried Zoloft in past with no benefit. She attends [employer].   Duration:somewhat better Anxious mood: improved Excessive worrying: occasional Irritability: less irritable Sweating: no Nausea: no Palpitations:no Hyperventilation: no Panic attacks: no Agoraphobia: no  Obscessions/compulsions: improving a little bit Depressed mood: no Depression screen [facility] 2/9 04/08/2021 01/26/2021 12/17/2020 08/13/2020 02/29/2020  Decreased Interest 0 0 _0 Down, Depressed, Hopeless 0 0 _1 PHQ - 2 Score 0 0 _2 Altered sleeping 0 0 _3 Tired, decreased energy 0 0 0 2 3  Change in  appetite 0 0 _4 Feeling bad or failure about yourself  0 0 0 - 2  Trouble concentrating _5 Moving slowly or fidgety/restless 1 2 0 0 3  Suicidal thoughts 0 0 0 0 0  PHQ-9 Score _6 Difficult doing work/chores Somewhat difficult - Somewhat difficult Not difficult at all Somewhat difficult  Some recent data might be hidden  Anhedonia: no Weight changes: no Insomnia: yes hard to stay asleep  Hypersomnia: no Fatigue/loss of energy: no Feelings of worthlessness: no Feelings of guilt: no Impaired concentration/indecisiveness: better Suicidal ideations: no  Crying spells: no Recent Stressors/Life Changes: yes   Relationship problems: no   Family stress: yes     Financial stress: no    Job stress: no    Recent death/loss: no GAD 7 : Generalized Anxiety Score 04/08/2021 01/26/2021 12/17/2020 08/13/2020  Nervous, Anxious, on Edge _7 Control/stop worrying _8 Worry too much - different things _9 Trouble relaxing 0 0 2 1  Restless _10 0  Easily annoyed or irritable _11 Afraid - awful might happen 1 0 0 0  Total GAD 7 Score _12 Anxiety Difficulty Somewhat difficult - Somewhat difficult -   Relevant past medical, surgical, family and social history reviewed and updated as indicated. Interim medical history since our last visit reviewed. Allergies and medications reviewed and updated.  Review of Systems  Constitutional:  Negative for activity change, appetite change, diaphoresis, fatigue and fever.  Respiratory:  Negative for cough, chest tightness and shortness of breath.   Cardiovascular:  Negative for chest pain, palpitations and leg swelling.  Psychiatric/Behavioral:  Negative for decreased concentration, self-injury, sleep disturbance and suicidal ideas. The patient is nervous/anxious.    Per HPI unless specifically indicated above     Objective:    BP 116/85   Pulse 99   Temp 98.3 F (36.8 C) (Oral)   Wt 116 lb 9.6 oz (52.9  kg)   SpO2 98%   BMI 21.33 kg/m   Wt Readings from Last 3 Encounters:  04/08/21 116 lb 9.6 oz (52.9 kg) (27 %, Z= -0.61)*  01/26/21 121 lb 9.6 oz (55.2 kg) (38 %, Z= -0.31)*  01/08/21 120 lb (54.4 kg) (35 %, Z= -0.40)*   * Growth percentiles are based on CDC (Girls, 2-20 Years) data.    Physical Exam Vitals and nursing note reviewed.  Constitutional:      General: She is awake. She is not in acute distress.    Appearance: She is well-developed and well-groomed. She is not ill-appearing.  HENT:     Head: Normocephalic.  Right Ear: Hearing normal.     Left Ear: Hearing normal.     Mouth/Throat:     Mouth: Mucous membranes are moist.     Dentition: Normal dentition. No gingival swelling.  Eyes:     General: Lids are normal.        Right eye: No discharge.        Left eye: No discharge.     Conjunctiva/sclera: Conjunctivae normal.     Pupils: Pupils are equal, round, and reactive to light.  Neck:     Thyroid: No thyromegaly.     Vascular: No carotid bruit.  Cardiovascular:     Rate and Rhythm: Normal rate and regular rhythm.     Heart sounds: Normal heart sounds. No murmur heard.   No gallop.  Pulmonary:     Effort: Pulmonary effort is normal. No accessory muscle usage or respiratory distress.     Breath sounds: Normal breath sounds.  Abdominal:     General: Bowel sounds are normal. There is no distension.     Palpations: Abdomen is soft. There is no hepatomegaly.     Tenderness: There is no abdominal tenderness. There is no right CVA tenderness or left CVA tenderness.  Musculoskeletal:     Cervical back: Normal range of motion and neck supple.     Right lower leg: No edema.     Left lower leg: No edema.  Skin:    General: Skin is warm and dry.  Neurological:     Mental Status: She is alert and oriented to person, place, and time.  Psychiatric:        Attention and Perception: Attention normal.        Mood and Affect: Mood normal.        Speech: Speech normal.         Behavior: Behavior normal. Behavior is cooperative.        Thought Content: Thought content normal.   Results for orders placed or performed in visit on 01/26/21  WET PREP FOR [place], YEAST, CLUE   Specimen: Sterile Swab   Sterile Swab  Result Value Ref Range   Trichomonas Exam Negative Negative   Yeast Exam Negative Negative   Clue Cell Exam Positive (A) Negative  Urine Culture   Specimen: Urine   UR  Result Value Ref Range   Urine Culture, Routine Final report (A)    Organism ID, Bacteria Escherichia coli (A)    Antimicrobial Susceptibility Comment   Microscopic Examination   Urine  Result Value Ref Range   WBC, UA 11-30 (A) 0 - 5 /hpf   RBC 11-30 (A) 0 - 2 /hpf   Epithelial Cells (non renal) 0-10 0 - 10 /hpf   Mucus, UA Present (A) Not Estab.   Bacteria, UA None seen None seen/Few  Urinalysis, Routine w reflex microscopic  Result Value Ref Range   Specific Gravity, UA 1.025 1.005 - 1.030   pH, UA 6.5 5.0 - 7.5   Color, UA Yellow Yellow   Appearance Ur Cloudy (A) Clear   Leukocytes,UA 3+ (A) Negative   Protein,UA 2+ (A) Negative/Trace   Glucose, UA Negative Negative   Ketones, UA Negative Negative   RBC, UA 3+ (A) Negative   Bilirubin, UA Negative Negative   Urobilinogen, Ur 1.0 0.2 - 1.0 mg/dL   Nitrite, UA Negative Negative   Microscopic Examination See below:       Assessment & Plan:   Problem List Items Addressed This Visit  Cardiovascular and Mediastinum   Near syncope    Noted last week while at camp, was on cycle.  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  Recommend continue good hydration daily when working outside for long periods, including electrolyte drinks like Gatorade.  Check BP and pulse if able to at camp, if episodes present again, and notify provider of findings.  Will obtain labs today.  Return in 4 weeks.       Relevant Orders   Ambulatory referral to  Gastroenterology     Other   Uses birth control    At this time she wishes to try alternate BCP = will switch to [place], script sent.  May do a direct switch once she has completed this months supply of Junel.  Educated her on this.  LMP was last week and no current pregnancy concerns.       Unintentional weight loss - Primary    Has lost 16 pounds since May 2021 and lost total of 5 pounds since last visit.  No red flags and she denies any disordered eating habits.  Recent labs stable.  At this time continue protein shakes, Premiere protein, for breakfast and then after lunch and dinner.  Suspect more anxiety driven, but will monitor closely.  Labs today: CBC, CMP, iron/ferritin, Celiac testing, TSH, B12.  Referral to GI for further assessment due to ongoing loss and recent abdominal discomfort.  Return in 4 weeks.       Relevant Orders   CBC with Differential/Platelet   Comprehensive metabolic panel   TSH   Gliadin antibodies, serum   Tissue transglutaminase, IgA   Reticulin Antibody, IgA w reflex titer   Iron, TIBC and Ferritin Panel   Ambulatory referral to Gastroenterology   Generalized abdominal pain    New onset over past week, ongoing since menstrual cycle stopped.  Continues to have weight loss, 5 pounds since last visit.  Labs: CBC, CMP, TSH, Celiac, iron/ferritin, B12 today.  Will also check UA, wet prep, and Gonorrhea/chlamydia on labs due to history of infections.  Referral to GI for further assessment due ongoing loss and recent abdominal discomfort.  She denies any disordered eating habits.  ?anxiety-related.  Return in 4 weeks.       Relevant Orders   Urinalysis, Routine w reflex microscopic   WET PREP FOR TRICH, YEAST, CLUE   Vitamin B12   GC/Chlamydia Probe Amp   Gliadin antibodies, serum   Tissue transglutaminase, IgA   Reticulin Antibody, IgA w reflex titer   Iron, TIBC and Ferritin Panel   Ambulatory referral to Gastroenterology     Follow up plan: Return  in about 4 weeks (around 05/06/2021) for Weight check.

## 2021-04-08 NOTE — Patient Instructions (Signed)
Preventing Consequences of Unhealthy Weight Loss Behaviors, Adult Reaching and maintaining a healthy weight is important for your overall health. A healthy weight will vary from person to person. It is natural to want to lose weight quickly, using whatever methods seem fastest. However, losing weight in a healthy way is not a quick process. Instead, aim for slow, steady weight lossby making small changes and setting achievable goals. How can unhealthy weight loss behaviors affect me? Using unhealthy behaviors to try to lose weight can cause: Tiredness, low heart rate, and low blood pressure. Imbalances in your body. These may be imbalances in: Electrolytes. These are salts and minerals in your blood. Chemicals. These are needed so your body can work properly. Body fluids. Loss of fluids may lead to dehydration. Organ damage or organ failure, especially affecting the kidneys. Thin bones that break easily. Staying away from others, or relationship problems with your friends and family. Emotional problems, including depression and anxiety. A greater risk of an eating disorder. If you develop an eating disorder, you could develop serious health problems and complications that affect your organs and bodily processes. Changing unhealthy weight loss behaviors through lifestyle changes will improve your overall health. Maintaining a healthy weight also lowers your risk of certain conditions, such as: Type 2 diabetes. Heart disease, high cholesterol, and high blood pressure. Osteoarthritis. This affects your joints. Osteoporosis. This affects your bones. Some cancers. What can increase my risk for unhealthy weight loss behaviors? Certain views or feelings about yourself and certain habits can increase your risk of unhealthy weight loss behaviors. These include: Having depression and being overweight as an adult. Attempting weight loss as a child or teen. Using alcohol, drugs, or tobacco  products. What actions can I take to prevent these behaviors? You can make certain lifestyle changes to help you lose weight in a healthyway. These include eating nutritious foods and exercising regularly. Nutrition  Eat a variety of healthy foods, including fruits and vegetables, whole grains, lean proteins, and low-fat dairy products. Drink water instead of sugary drinks. Drink enough fluid to keep your urine pale yellow. Plan healthy, balanced meals. Work with a Financial planner (dietitian) to make a healthy meal plan that works for you. Limit the following: Foods that are high in fat, salt (sodium), or sugar. These include candy, donuts, pizza, and fast foods. Fried or heavily processed foods. Drinks that contain a lot of sugar.  Lifestyle Avoid these unhealthy eating habits: Following a diet that restricts entire types of food. This may be a popular diet that promises extreme results in a short time. Skipping meals to save calories. Not eating anything for long periods of time (fasting). Restricting your calories to far fewer than the number that you need to lose or maintain a healthy weight. Taking laxative pills to make you have more frequent bowel movements. Taking medicines to make your body lose excess fluids (diuretics). Eating an excessive amount of food and then making yourself vomit. This is known as bingeing and purging. If you drink alcohol: Limit how much you use to: 0-1 drink a day for nonpregnant women. 0-2 drinks a day for men. Be aware of how much alcohol is in your drink. In the [place] one drink equals one 12 oz bottle of beer (355 mL), one 5 oz glass of wine (148 mL), or one 1 oz glass of hard liquor (44 mL). Do not use any products that contain nicotine or tobacco, such as cigarettes, e-cigarettes, and chewing tobacco. If you need  help quitting, ask your health care provider. Activity  Avoid compulsively getting an extreme amount of exercise. Work with a  Microbiologist to make a healthy exercise program. Include different types of exercise in your exercise program, such as strengthening, aerobic, and flexibility exercises. To maintain your weight, get at least 150 minutes of moderate-intensity exercise each week. Moderate-intensity exercise could be brisk walking or biking. To lose a healthy amount of weight, get 60 minutes of moderate-intensity exercise each day. Find ways to reduce stress, such as regular exercise or meditation. Find a hobby or other activity that you enjoy to distract you from eating when you feel stressed or bored.  Where to find support For more support, talk with: Your health care provider or dietitian. Ask about support groups. A mental health care provider. Family and friends. Where to find more information Learn more about how to prevent complications from unhealthy weight loss behaviors from: Centers for Disease Control and Prevention: http://www.wolf.info/ National Institute of Mental Health: https://carter.com/ National Eating Disorders Association: www.nationaleatingdisorders.org Contact a health care provider if: You often feel very tired. You notice changes in your skin or your hair. You faint because of dehydration or too much exercise. You struggle to change your unhealthy weight loss behaviors on your own. Unhealthy weight loss behaviors are affecting your daily life or your relationships. You have signs or symptoms of an eating disorder. You have major weight changes in a short period of time. You feel guilty or ashamed about eating or exercising. Summary Using unhealthy eating behaviors to try to lose weight can cause a variety of physical and emotional problems that affect your overall health and well-being. Aim for slow, steady weight loss by choosing healthy foods, avoiding unhealthy eating habits, and exercising regularly. Contact your health care provider if you struggle to change your behaviors on your own or if  you think that you may have an eating disorder. This information is not intended to replace advice given to you by your health care provider. Make sure you discuss any questions you have with your healthcare provider. Document Revised: 08/28/2019 Document Reviewed: 08/28/2019 Elsevier Patient Education  2022 [employer].

## 2021-04-08 NOTE — Assessment & Plan Note (Signed)
Has lost 16 pounds since May 2021 and lost total of 5 pounds since last visit.  No red flags and she denies any disordered eating habits.  Recent labs stable.  At this time continue protein shakes, Premiere protein, for breakfast and then after lunch and dinner.  Suspect more anxiety driven, but will monitor closely.  Labs today: CBC, CMP, iron/ferritin, Celiac testing, TSH, B12.  Referral to GI for further assessment due to ongoing loss and recent abdominal discomfort.  Return in 4 weeks.

## 2021-04-08 NOTE — Assessment & Plan Note (Signed)
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  Recommend continue good hydration daily when working outside for long periods, including electrolyte drinks like Gatorade.  Check BP and pulse if able to at camp, if episodes present again, and notify provider of findings.  Will obtain labs today.  Return in 4 weeks.

## 2021-04-09 LAB — URINALYSIS, ROUTINE W REFLEX MICROSCOPIC
Bilirubin, UA: NEGATIVE
Glucose, UA: NEGATIVE
Ketones, UA: NEGATIVE
Leukocytes,UA: NEGATIVE
Nitrite, UA: NEGATIVE
Protein,UA: NEGATIVE
RBC, UA: NEGATIVE
Specific Gravity, UA: 1.025 (ref 1.005–1.030)
Urobilinogen, Ur: >8 mg/dL — ABNORMAL HIGH (ref 0.2–1.0)
pH, UA: 6 (ref 5.0–7.5)

## 2021-04-09 LAB — WET PREP FOR TRICH, YEAST, CLUE
Clue Cell Exam: NEGATIVE
Trichomonas Exam: NEGATIVE
Yeast Exam: NEGATIVE

## 2021-04-09 NOTE — Progress Notes (Signed)
Contacted via MyChart   Good afternoon Brenda Rangel, your labs have 1/2 returned.  Waiting on Celiac labs, but current labs continue to be nice and normal.  GI should be calling to schedule over next week, let me know if you do not hear from them.  Ensure to drink protein shakes, Premiere protein or Ensure, three times a day.  I will let you know when remainder of labs return. Keep being awesome!!  Thank you for allowing me to participate in your care.  I appreciate you. Kindest regards, Deklyn Gibbon

## 2021-04-10 LAB — GC/CHLAMYDIA PROBE AMP
Chlamydia trachomatis, NAA: NEGATIVE
Neisseria Gonorrhoeae by PCR: NEGATIVE

## 2021-04-10 NOTE — Progress Notes (Signed)
Contacted via MyChart   Waiting on one more of the Celiac labs to return, current ones are negative.

## 2021-04-11 LAB — CBC WITH DIFFERENTIAL/PLATELET
Basophils Absolute: 0.1 10*3/uL (ref 0.0–0.2)
Basos: 1 %
EOS (ABSOLUTE): 0.1 10*3/uL (ref 0.0–0.4)
Eos: 2 %
Hematocrit: 43.7 % (ref 34.0–46.6)
Hemoglobin: 14.7 g/dL (ref 11.1–15.9)
Immature Grans (Abs): 0 10*3/uL (ref 0.0–0.1)
Immature Granulocytes: 0 %
Lymphocytes Absolute: 2.7 10*3/uL (ref 0.7–3.1)
Lymphs: 36 %
MCH: 29.8 pg (ref 26.6–33.0)
MCHC: 33.6 g/dL (ref 31.5–35.7)
MCV: 89 fL (ref 79–97)
Monocytes Absolute: 0.7 10*3/uL (ref 0.1–0.9)
Monocytes: 9 %
Neutrophils Absolute: 3.9 10*3/uL (ref 1.4–7.0)
Neutrophils: 52 %
Platelets: 271 10*3/uL (ref 150–450)
RBC: 4.93 x10E6/uL (ref 3.77–5.28)
RDW: 11.6 % — ABNORMAL LOW (ref 11.7–15.4)
WBC: 7.5 10*3/uL (ref 3.4–10.8)

## 2021-04-11 LAB — COMPREHENSIVE METABOLIC PANEL
ALT: 13 IU/L (ref 0–32)
AST: 20 IU/L (ref 0–40)
Albumin/Globulin Ratio: 2 (ref 1.2–2.2)
Albumin: 4.7 g/dL (ref 3.9–5.0)
Alkaline Phosphatase: 50 IU/L (ref 42–106)
BUN/Creatinine Ratio: 11 (ref 9–23)
BUN: 8 mg/dL (ref 6–20)
Bilirubin Total: 0.6 mg/dL (ref 0.0–1.2)
CO2: 24 mmol/L (ref 20–29)
Calcium: 9.6 mg/dL (ref 8.7–10.2)
Chloride: 101 mmol/L (ref 96–106)
Creatinine, Ser: 0.7 mg/dL (ref 0.57–1.00)
Globulin, Total: 2.4 g/dL (ref 1.5–4.5)
Glucose: 87 mg/dL (ref 65–99)
Potassium: 4 mmol/L (ref 3.5–5.2)
Sodium: 139 mmol/L (ref 134–144)
Total Protein: 7.1 g/dL (ref 6.0–8.5)
eGFR: 128 mL/min/{1.73_m2} (ref >59)

## 2021-04-11 LAB — GLIADIN ANTIBODIES, SERUM
Antigliadin Abs, IgA: 5 units (ref 0–19)
Gliadin IgG: 4 units (ref 0–19)

## 2021-04-11 LAB — TSH: TSH: 0.981 u[IU]/mL (ref 0.450–4.500)

## 2021-04-11 LAB — IRON,TIBC AND FERRITIN PANEL
Ferritin: 67 ng/mL (ref 15–77)
Iron Saturation: 31 % (ref 15–55)
Iron: 97 ug/dL (ref 27–159)
Total Iron Binding Capacity: 317 ug/dL (ref 250–450)
UIBC: 220 ug/dL (ref 131–425)

## 2021-04-11 LAB — RETICULIN ANTIBODIES, IGA W TITER: Reticulin Ab, IgA: NEGATIVE titer (ref <2.5)

## 2021-04-11 LAB — TISSUE TRANSGLUTAMINASE, IGA: Transglutaminase IgA: <2 U/mL (ref 0–3)

## 2021-04-11 LAB — VITAMIN B12: Vitamin B-12: 657 pg/mL (ref 232–1245)

## 2021-05-05 ENCOUNTER — Ambulatory Visit: Payer: Federal, State, Local not specified - PPO | Admitting: Nurse Practitioner

## 2021-05-08 ENCOUNTER — Encounter: Payer: Self-pay | Admitting: Nurse Practitioner

## 2021-05-08 ENCOUNTER — Other Ambulatory Visit: Payer: Self-pay

## 2021-05-08 ENCOUNTER — Ambulatory Visit: Payer: Federal, State, Local not specified - PPO | Admitting: Nurse Practitioner

## 2021-05-08 VITALS — BP 114/73 | HR 99 | Temp 98.8°F | Wt 119.8 lb

## 2021-05-08 DIAGNOSIS — F411 Generalized anxiety disorder: Secondary | ICD-10-CM | POA: Diagnosis not present

## 2021-05-08 DIAGNOSIS — R634 Abnormal weight loss: Secondary | ICD-10-CM | POA: Diagnosis not present

## 2021-05-08 MED ORDER — BUSPIRONE HCL 5 MG PO TABS: 5.0000 mg | ORAL_TABLET | Freq: Two times a day (BID) | ORAL | 4 refills | Status: DC

## 2021-05-08 NOTE — Patient Instructions (Signed)

## 2021-05-08 NOTE — Progress Notes (Signed)
BP 114/73   Pulse 99   Temp 98.8 F (37.1 C) (Oral)   Wt 119 lb 12.8 oz (54.3 kg)   SpO2 98%   BMI 21.91 kg/m    Subjective:    Patient ID: Brenda Rangel, female    DOB: Mar 24, 2001, 20 y.o.   MRN: [id number]  HPI: Brenda Rangel is a 20 y.o. female  Chief Complaint  Patient presents with   Weight Check    Patient states her weight is still up and down she says she still hasn't went up pass 120 lbs and hasn't went down pass 114 lbs. Patient has eaten before this appointment.   MOOD    Patient states she feels a bit irritable a lot lately and feels on edge a lot lately, but patient states she noticing dizzy spells more frequently and states they have not been going outside as much. Patient states she stays hydrated with her water jug.    ANXIETY/STRESS Taking Prozac 40 MG.  She got into [facility] dental assistance program.  Has not been taking Vistaril.  Has tried Zoloft in past with no benefit. She is attempting to get back into [facility], saw them last beginning of this year.    Does eat 2 meals a day -- mainly breakfast and dinner at baseline -- denies any binging, purging, avoiding food, or frequent exercise. Has had some weight gain, but 5 pounds, this visit -- she had been concerned about loss.  May 2021 weight was 132 lbs.  Recent labs all reassuring.  She is schedule to see GI on August 31st.   Duration: ongoing Anxious mood: yes Excessive worrying: yes Irritability: yes Sweating: no Nausea: no Palpitations: rapid heart rate with anxiety Hyperventilation: no Panic attacks: x 1 recently, this week Agoraphobia: no  Obscessions/compulsions: occasional Depressed mood: no Depression screen [facility] 2/9 05/08/2021 04/08/2021 01/26/2021 12/17/2020 08/13/2020  Decreased Interest 0 0 0 2 2  Down, Depressed, Hopeless 0 0 0 2 2  PHQ - 2 Score 0 0 0 4 4  Altered sleeping 1 0 0 2 1  Tired, decreased energy 1 0 0 0 2  Change in appetite 0 0 0 2 2  Feeling bad or failure about  yourself  0 0 0 0 -  Trouble concentrating _0 Moving slowly or fidgety/restless 0 1 2 0 0  Suicidal thoughts 0 0 0 0 0  PHQ-9 Score _1 Difficult doing work/chores Not difficult at all Somewhat difficult - Somewhat difficult Not difficult at all  Some recent data might be hidden  Anhedonia: no Weight changes: no Insomnia: yes hard to stay asleep  Hypersomnia: no Fatigue/loss of energy: no Feelings of worthlessness: no Feelings of guilt: no Impaired concentration/indecisiveness: yes Suicidal ideations: no  Crying spells: no Recent Stressors/Life Changes: yes   Relationship problems: no   Family stress: yes     Financial stress: no    Job stress: no    Recent death/loss: no GAD 7 : Generalized Anxiety Score 05/08/2021 04/08/2021 01/26/2021 12/17/2020  Nervous, Anxious, on Edge _2 Control/stop worrying _3 Worry too much - different things _4 Trouble relaxing 2 0 0 2  Restless _5 Easily annoyed or irritable _6 Afraid - awful might happen 1 1 0 0  Total GAD 7 Score _7 Anxiety Difficulty Very difficult  Somewhat difficult - Somewhat difficult   Relevant past medical, surgical, family and social history reviewed and updated as indicated. Interim medical history since our last visit reviewed. Allergies and medications reviewed and updated.  Review of Systems  Constitutional:  Negative for activity change, appetite change, diaphoresis, fatigue and fever.  Respiratory:  Negative for cough, chest tightness and shortness of breath.   Cardiovascular:  Negative for chest pain, palpitations and leg swelling.  Psychiatric/Behavioral:  Negative for decreased concentration, self-injury, sleep disturbance and suicidal ideas. The patient is nervous/anxious.    Per HPI unless specifically indicated above     Objective:    BP 114/73   Pulse 99   Temp 98.8 F (37.1 C) (Oral)   Wt 119 lb 12.8 oz (54.3 kg)   SpO2 98%   BMI 21.91 kg/m    Wt Readings from Last 3 Encounters:  05/08/21 119 lb 12.8 oz (54.3 kg) (33 %, Z= -0.43)*  04/08/21 116 lb 9.6 oz (52.9 kg) (27 %, Z= -0.61)*  01/26/21 121 lb 9.6 oz (55.2 kg) (38 %, Z= -0.31)*   * Growth percentiles are based on CDC (Girls, 2-20 Years) data.    Physical Exam Vitals and nursing note reviewed.  Constitutional:      General: She is awake. She is not in acute distress.    Appearance: She is well-developed and well-groomed. She is not ill-appearing.  HENT:     Head: Normocephalic.     Right Ear: Hearing normal.     Left Ear: Hearing normal.     Mouth/Throat:     Mouth: Mucous membranes are moist.     Dentition: Normal dentition. No gingival swelling.  Eyes:     General: Lids are normal.        Right eye: No discharge.        Left eye: No discharge.     Conjunctiva/sclera: Conjunctivae normal.     Pupils: Pupils are equal, round, and reactive to light.  Neck:     Thyroid: No thyromegaly.     Vascular: No carotid bruit.  Cardiovascular:     Rate and Rhythm: Normal rate and regular rhythm.     Heart sounds: Normal heart sounds. No murmur heard.   No gallop.  Pulmonary:     Effort: Pulmonary effort is normal. No accessory muscle usage or respiratory distress.     Breath sounds: Normal breath sounds.  Abdominal:     General: Bowel sounds are normal. There is no distension.     Palpations: Abdomen is soft. There is no hepatomegaly.     Tenderness: There is no abdominal tenderness. There is no right CVA tenderness or left CVA tenderness.  Musculoskeletal:     Cervical back: Normal range of motion and neck supple.     Right lower leg: No edema.     Left lower leg: No edema.  Skin:    General: Skin is warm and dry.  Neurological:     Mental Status: She is alert and oriented to person, place, and time.  Psychiatric:        Attention and Perception: Attention normal.        Mood and Affect: Mood normal.        Speech: Speech normal.        Behavior: Behavior  normal. Behavior is cooperative.        Thought Content: Thought content normal.   Results for orders placed or performed in visit on 04/08/21  WET [place]  [place], YEAST, CLUE   Specimen: Sterile Swab   Sterile Swab  Result Value Ref Range   Trichomonas Exam Negative Negative   Yeast Exam Negative Negative   Clue Cell Exam Negative Negative  GC/Chlamydia Probe Amp   Specimen: Urine   UR  Result Value Ref Range   Chlamydia trachomatis, NAA Negative Negative   Neisseria Gonorrhoeae by PCR Negative Negative  Urinalysis, Routine w reflex microscopic  Result Value Ref Range   Specific Gravity, UA 1.025 1.005 - 1.030   pH, UA 6.0 5.0 - 7.5   Color, UA Yellow Yellow   Appearance Ur Clear Clear   Leukocytes,UA Negative Negative   Protein,UA Negative Negative/Trace   Glucose, UA Negative Negative   Ketones, UA Negative Negative   RBC, UA Negative Negative   Bilirubin, UA Negative Negative   Urobilinogen, Ur >8.0 (H) 0.2 - 1.0 mg/dL   Nitrite, UA Negative Negative  CBC with Differential/Platelet  Result Value Ref Range   WBC 7.5 3.4 - 10.8 x10E3/uL   RBC 4.93 3.77 - 5.28 x10E6/uL   Hemoglobin 14.7 11.1 - 15.9 g/dL   Hematocrit 43.7 34.0 - 46.6 %   MCV 89 79 - 97 fL   MCH 29.8 26.6 - 33.0 pg   MCHC 33.6 31.5 - 35.7 g/dL   RDW 11.6 (L) 11.7 - 15.4 %   Platelets 271 150 - 450 x10E3/uL   Neutrophils 52 Not Estab. %   Lymphs 36 Not Estab. %   Monocytes 9 Not Estab. %   Eos 2 Not Estab. %   Basos 1 Not Estab. %   Neutrophils Absolute 3.9 1.4 - 7.0 x10E3/uL   Lymphocytes Absolute 2.7 0.7 - 3.1 x10E3/uL   Monocytes Absolute 0.7 0.1 - 0.9 x10E3/uL   EOS (ABSOLUTE) 0.1 0.0 - 0.4 x10E3/uL   Basophils Absolute 0.1 0.0 - 0.2 x10E3/uL   Immature Granulocytes 0 Not Estab. %   Immature Grans (Abs) 0.0 0.0 - 0.1 x10E3/uL  Comprehensive metabolic panel  Result Value Ref Range   Glucose 87 65 - 99 mg/dL   BUN 8 6 - 20 mg/dL   Creatinine, Ser 0.70 0.57 - 1.00 mg/dL   eGFR 128 >59  mL/min/1.73   BUN/Creatinine Ratio 11 9 - 23   Sodium 139 134 - 144 mmol/L   Potassium 4.0 3.5 - 5.2 mmol/L   Chloride 101 96 - 106 mmol/L   CO2 24 20 - 29 mmol/L   Calcium 9.6 8.7 - 10.2 mg/dL   Total Protein 7.1 6.0 - 8.5 g/dL   Albumin 4.7 3.9 - 5.0 g/dL   Globulin, Total 2.4 1.5 - 4.5 g/dL   Albumin/Globulin Ratio 2.0 1.2 - 2.2   Bilirubin Total 0.6 0.0 - 1.2 mg/dL   Alkaline Phosphatase 50 42 - 106 IU/L   AST 20 0 - 40 IU/L   ALT 13 0 - 32 IU/L  TSH  Result Value Ref Range   TSH 0.981 0.450 - 4.500 uIU/mL  Vitamin B12  Result Value Ref Range   Vitamin B-12 657 232 - 1,245 pg/mL  Gliadin antibodies, serum  Result Value Ref Range   Antigliadin Abs, IgA 5 0 - 19 units   Gliadin IgG 4 0 - 19 units  Tissue transglutaminase, IgA  Result Value Ref Range   Transglutaminase IgA <2 0 - 3 U/mL  Reticulin Antibody, IgA w reflex titer  Result Value Ref Range   Reticulin Ab, IgA Negative Neg:<1:2.5 titer  Iron, TIBC and Ferritin Panel  Result  Value Ref Range   Total Iron Binding Capacity 317 250 - 450 ug/dL   UIBC 220 131 - 425 ug/dL   Iron 97 27 - 159 ug/dL   Iron Saturation 31 15 - 55 %   Ferritin 67 15 - 77 ng/mL      Assessment & Plan:   Problem List Items Addressed This Visit       Other   Generalized anxiety disorder - Primary    Ongoing, treated in past with Zoloft with no benefit.  Denies SI/HI.  Continue Prozac 40 MG and add on Buspar 5 MG BID, adjust as needed, educated her on this medication and possible side effects.  Continue Vistaril as needed for SEVERE anxiety episodes.  Educated on medications.  Recommend meditation and relaxation exercises at home + continue therapy sessions.  Therapy should offer benefit to overall mood.  Return in 6 weeks.       Relevant Medications   busPIRone (BUSPAR) 5 MG tablet   Unintentional weight loss    Has gained at this visit, 3 pounds since last check.  Will review GI note after her upcoming visit.  Recent labs  reassuring.         Follow up plan: Return in about 6 weeks (around 06/19/2021) for Anxiety -- Buspar added last visit.

## 2021-05-08 NOTE — Assessment & Plan Note (Signed)
Ongoing, treated in past with Zoloft with no benefit.  Denies SI/HI.  Continue Prozac 40 MG and add on Buspar 5 MG BID, adjust as needed, educated her on this medication and possible side effects.  Continue Vistaril as needed for SEVERE anxiety episodes.  Educated on medications.  Recommend meditation and relaxation exercises at home + continue therapy sessions.  Therapy should offer benefit to overall mood.  Return in 6 weeks.

## 2021-05-08 NOTE — Assessment & Plan Note (Signed)
Has gained at this visit, 3 pounds since last check.  Will review GI note after her upcoming visit.  Recent labs reassuring.

## 2021-05-21 ENCOUNTER — Other Ambulatory Visit: Payer: Self-pay

## 2021-05-21 ENCOUNTER — Ambulatory Visit
Admission: EM | Admit: 2021-05-21 | Discharge: 2021-05-21 | Disposition: A | Discharge status: Home / Self Care | Payer: Federal, State, Local not specified - PPO | Attending: Emergency Medicine | Admitting: Emergency Medicine

## 2021-05-21 DIAGNOSIS — N390 Urinary tract infection, site not specified: Secondary | ICD-10-CM | POA: Insufficient documentation

## 2021-05-21 LAB — URINALYSIS, COMPLETE (UACMP) WITH MICROSCOPIC
Bilirubin Urine: NEGATIVE
Glucose, UA: NEGATIVE mg/dL
Ketones, ur: NEGATIVE mg/dL
Nitrite: NEGATIVE
Protein, ur: 100 mg/dL — AB
RBC / HPF: >50 RBC/hpf (ref 0–5)
Specific Gravity, Urine: 1.025 (ref 1.005–1.030)
WBC, UA: >50 WBC/hpf (ref 0–5)
pH: 6 (ref 5.0–8.0)

## 2021-05-21 LAB — PREGNANCY, URINE: Preg Test, Ur: NEGATIVE

## 2021-05-21 MED ORDER — NITROFURANTOIN MONOHYD MACRO 100 MG PO CAPS: 100.0000 mg | ORAL_CAPSULE | Freq: Two times a day (BID) | ORAL | 0 refills | Status: DC

## 2021-05-21 NOTE — ED Triage Notes (Signed)
Pt here with C/O dysuria, some on Monday and today woke up with blood, and burning, mild cramps and lower abdominal pain. LMP 3 weeks ago.

## 2021-05-21 NOTE — ED Provider Notes (Signed)
[facility]    CSN: [id number] Arrival date & time: 05/21/21  0815      History   Chief Complaint Chief Complaint  Patient presents with   Dysuria    HPI Brenda Rangel is a 20 y.o. female.   Patient is a 20 year old female who presents with concern for UTI.  Patient states she typically has UTIs once every couple months.  States typically she will get unusual odor and urgency without much urine production.  Patient dates that the symptoms last anywhere from a few hours to a day or so.  She states this past Monday she noted a bad smell and burning sensation.  Tuesday and Wednesday she had no symptoms but this morning woke up with blood in her underwear also noted blood with her urination and with wiping.  She also reports lower abdominal pain.  Patient denies any vaginal discharge or burning sensation and that her burning is at the orifice of the urethra.  Reports her last period was 3 weeks ago that was normal.  She denies any concern for pregnancy.  Patient denies any fevers or any pain with walking.  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      Home Medications    Prior to Admission medications   Medication Sig Start Date End Date Taking? Authorizing Provider  busPIRone (BUSPAR) 5 MG tablet Take 1 tablet (5 mg total) by mouth 2 (two) times daily. 05/08/21  Yes Cannady, Jolene T, NP  FLUoxetine (PROZAC) 40 MG capsule Take 1 capsule (40 mg total) by mouth daily. 02/29/20  Yes Cannady, Henrine Screws T, NP   levonorgestrel-ethinyl estradiol (LILLOW) 0.15-30 MG-MCG tablet Take 1 tablet by mouth daily. 04/08/21  Yes Cannady, Jolene T, NP  nitrofurantoin, macrocrystal-monohydrate, (MACROBID) 100 MG capsule Take 1 capsule (100 mg total) by mouth 2 (two) times daily. 05/21/21  Yes Luvenia Redden, PA-C  hydrOXYzine (ATARAX/VISTARIL) 10 MG tablet Take 1 tablet (10 mg total) by mouth 3 (three) times daily as needed. 11/16/19   Venita Lick, NP    Family History Family History  Problem Relation Age of Onset   Anxiety disorder Mother    Depression Mother    Anxiety disorder Father    Depression Father    Pectus excavatum Sister    Breast cancer Maternal Grandmother    Pectus excavatum Adoptive Father     Social History Social History   Tobacco Use   Smoking status: Never   Smokeless tobacco: Never  Vaping Use   Vaping Use: Never used  Substance Use Topics   Alcohol use: Yes    Comment: soccially   Drug use: Never     Allergies   Patient has no known allergies.   Review of Systems Review of Systems as noted above in HPI.  Other systems reviewed and found to be negative   Physical Exam Triage Vital Signs ED Triage Vitals  Enc Vitals Group     BP 05/21/21  0826 123/86     Pulse Rate 05/21/21 0826 97     Resp 05/21/21 0826 16     Temp 05/21/21 0826 98 F (36.7 C)     Temp Source 05/21/21 0826 Oral     SpO2 05/21/21 0826 100 %     Weight 05/21/21 0824 115 lb (52.2 kg)     Height 05/21/21 0824 '5\' 3"'$  (1.6 m)     Head Circumference --      Peak Flow --      Pain Score 05/21/21 0824 7     Pain Loc --      Pain Edu? --      Excl. in [place]? --    No data found.  Updated Vital Signs BP 123/86 (BP Location: Right Arm)   Pulse 97   Temp 98 F (36.7 C) (Oral)   Resp 16   Ht '5\' 3"'$  (1.6 m)   Wt 115 lb (52.2 kg)   LMP 04/30/2021   SpO2 100%   BMI 20.37 kg/m    Physical Exam Constitutional:      General: She is not in acute distress.    Appearance: Normal appearance.  She is normal weight. She is not toxic-appearing.  HENT:     Head: Normocephalic and atraumatic.  Cardiovascular:     Rate and Rhythm: Normal rate.     Pulses: Normal pulses.  Pulmonary:     Effort: Pulmonary effort is normal. No respiratory distress.  Abdominal:     General: Abdomen is flat.     Palpations: Abdomen is soft.     Tenderness: There is abdominal tenderness (tenderness to palpation over bladder, minimal upper abdominal tenderness). There is no right CVA tenderness, left CVA tenderness, guarding or rebound.  Musculoskeletal:        General: Normal range of motion.  Skin:    General: Skin is warm and dry.  Neurological:     General: No focal deficit present.     Mental Status: She is alert and oriented to person, place, and time.     UC Treatments / Results  Labs (all labs ordered are listed, but only abnormal results are displayed) Labs Reviewed  URINALYSIS, COMPLETE (UACMP) WITH MICROSCOPIC - Abnormal; Notable for the following components:      Result Value   APPearance CLOUDY (*)    Hgb urine dipstick LARGE (*)    Protein, ur 100 (*)    Leukocytes,Ua MODERATE (*)    Bacteria, UA FEW (*)    All other components within normal limits  URINE CULTURE  PREGNANCY, URINE    EKG   Radiology No results found.  Procedures Procedures (including critical care time)  Medications Ordered in UC Medications - No data to display  Initial Impression / Assessment and Plan / UC Course  I have reviewed the triage vital signs and the nursing notes.  Pertinent labs & imaging results that were available during my care of the patient were reviewed by me and considered in my medical decision making (see chart for details).    Patient reports history of frequent UTIs.  Patient does have history of E. coli UTI earlier this year.  Patient also had previous positive chlamydia.  Will check UTI as well as urine pregnancy.  Negative urine pregnancy test.  UTI positive.  Will cover  with Macrobid and send a culture.  Final Clinical Impressions(s) / UC Diagnoses   Final diagnoses:  Lower urinary tract infectious disease  Discharge Instructions      -Macrobid: Take 1 tablet twice a day for 5 days -Urine will be sent for culture.  If any antibiotic changes needed you will be notified -Can use over-the-counter Tylenol or ibuprofen for pain. -Can also use over-the-counter pyridium/Evo for bladder pain. -Drink plenty of fluids      ED Prescriptions     Medication Sig Dispense Auth. Provider   nitrofurantoin, macrocrystal-monohydrate, (MACROBID) 100 MG capsule Take 1 capsule (100 mg total) by mouth 2 (two) times daily. 10 capsule Luvenia Redden, PA-C      PDMP not reviewed this encounter.   Luvenia Redden, PA-C 05/21/21 [phone]

## 2021-05-21 NOTE — Discharge Instructions (Addendum)
-  Macrobid: Take 1 tablet twice a day for 5 days -Urine will be sent for culture.  If any antibiotic changes needed you will be notified -Can use over-the-counter Tylenol or ibuprofen for pain. -Can also use over-the-counter pyridium/Evo for bladder pain. -Drink plenty of fluids

## 2021-05-23 LAB — URINE CULTURE: Culture: <10000 — AB

## 2021-05-26 ENCOUNTER — Encounter: Payer: Self-pay | Admitting: Nurse Practitioner

## 2021-05-26 ENCOUNTER — Ambulatory Visit: Payer: Federal, State, Local not specified - PPO | Admitting: Nurse Practitioner

## 2021-05-26 ENCOUNTER — Other Ambulatory Visit: Payer: Self-pay

## 2021-05-26 VITALS — BP 131/76 | HR 99 | Temp 98.4°F | Wt 121.0 lb

## 2021-05-26 DIAGNOSIS — B353 Tinea pedis: Secondary | ICD-10-CM | POA: Diagnosis not present

## 2021-05-26 DIAGNOSIS — N390 Urinary tract infection, site not specified: Secondary | ICD-10-CM | POA: Insufficient documentation

## 2021-05-26 DIAGNOSIS — R3121 Asymptomatic microscopic hematuria: Secondary | ICD-10-CM | POA: Diagnosis not present

## 2021-05-26 DIAGNOSIS — R8281 Pyuria: Secondary | ICD-10-CM

## 2021-05-26 LAB — URINALYSIS, ROUTINE W REFLEX MICROSCOPIC
Bilirubin, UA: NEGATIVE
Glucose, UA: NEGATIVE
Ketones, UA: NEGATIVE
Nitrite, UA: NEGATIVE
Protein,UA: NEGATIVE
Specific Gravity, UA: 1.025 (ref 1.005–1.030)
Urobilinogen, Ur: 1 mg/dL (ref 0.2–1.0)
pH, UA: 6.5 (ref 5.0–7.5)

## 2021-05-26 LAB — WET PREP FOR TRICH, YEAST, CLUE
Clue Cell Exam: NEGATIVE
Trichomonas Exam: NEGATIVE
Yeast Exam: NEGATIVE

## 2021-05-26 LAB — MICROSCOPIC EXAMINATION

## 2021-05-26 NOTE — Patient Instructions (Signed)

## 2021-05-26 NOTE — Assessment & Plan Note (Addendum)
Acute with waxing and waning discomfort and urinary symptoms.  UA today noting trace blood and many bacteria, will send for culture.  Wet prep negative.  Will check urine for GC/Chlamydia.  Educated on kidney stones and kidney function, reviewed recent blood work with her.  Will send to urology per patient request, for reassurance and further assessment as patient very concerned about kidney stones and kidney function.  Obtain CT scan abd/pelvis to further assess.  Plan to return in September as scheduled for follow-up, sooner if worsening symptoms.  Blood work up to date from recent visit.

## 2021-05-26 NOTE — Assessment & Plan Note (Signed)
Acute and intermittent to bilateral feet.  Recommend use of OTC Lamisil spray.  Ensure well dried feet after shower or long periods on feet, then apply spray and allow to dry prior to placing socks or shoes on.  For ongoing or worsening, return to office.

## 2021-05-26 NOTE — Progress Notes (Signed)
 BP 131/76   Pulse 99   Temp 98.4 F (36.9 C) (Oral)   Wt 121 lb (54.9 kg)   LMP 04/30/2021   SpO2 98%   BMI 21.43 kg/m    Subjective:    Patient ID: Brenda Rangel, female    DOB: 03/26/2001, 19 y.o.   MRN: [id number]  HPI: Brenda Rangel is a 19 y.o. female  Chief Complaint  Patient presents with   Follow-up    Patient states Monday of last week and patient states she had 2 symptoms of bad odor and burning. Patient states she has spoken with Jolene about this prior. Patient states she did not have any symptoms for 2 days and then Thursday she woke up with blood in her underwear, hematuria, urinary urgency and stomach pain. Patient state she went to Urgent Care and was tested for a UTI and then received a call back from UC and state she was informed that it may have been a kidney stone. Patient states she is co   URINARY SYMPTOMS Reports she recently went to UC for urinary symptoms and she was told she may have kidney stones due to blood in urine -- recommended urology referral.  Reports having UTI symptoms every two months with odor and urgency, but these often resolve on own.  During recent episode has blood in underwear and with urination. Was given Macrobid for UTI by urgent care -- UA in UC did note moderate leuks and blood (was not on cycle), growth was <10,000.  Has history of chlamydia one year ago which was treated.  She is concerned about kidney disease due to reading on [employer].  Recent labs reviewed with her with unremarkable eGFR and CRT.  Denies any family history of kidney stones.   Dysuria: no Urinary frequency: no Urgency: yes Small volume voids: no Symptom severity: yes Urinary incontinence: no Foul odor: yes Hematuria: yes Abdominal pain: yes Back pain: yes Suprapubic pain/pressure: no Flank pain: yes Fever:  no Vomiting:  nausea but not throwing up Status: stable Previous urinary tract infection: yes Recurrent urinary tract infection: no Sexual  activity: monogamous History of sexually transmitted disease: yes Treatments attempted: increasing fluids    DRY FEET: Reports dryness and cracking to feet bilaterally and her mother reports she may have athlete's foot.  Wears flip flops a lot, when at work she wears running shoes.  Her sister has athlete's feet issues in past.  Denies itching or pain to area.  Has not used any OTC products.  Relevant past medical, surgical, family and social history reviewed and updated as indicated. Interim medical history since our last visit reviewed. Allergies and medications reviewed and updated.  Review of Systems  Constitutional:  Negative for activity change, appetite change, diaphoresis, fatigue and fever.  Respiratory:  Negative for cough, chest tightness and shortness of breath.   Cardiovascular:  Negative for chest pain, palpitations and leg swelling.  Gastrointestinal:  Positive for abdominal pain. Negative for abdominal distention, constipation, diarrhea, nausea and vomiting.  Genitourinary:  Positive for hematuria and urgency. Negative for decreased urine volume, dysuria and frequency.  Neurological: Negative.   Psychiatric/Behavioral: Negative.     Per HPI unless specifically indicated above     Objective:    BP 131/76   Pulse 99   Temp 98.4 F (36.9 C) (Oral)   Wt 121 lb (54.9 kg)   LMP 04/30/2021   SpO2 98%   BMI 21.43 kg/m   Wt Readings from Last 3 Encounters:    05/26/21 121 lb (54.9 kg) (36 %, Z= -0.37)*  05/21/21 115 lb (52.2 kg) (24 %, Z= -0.72)*  05/08/21 119 lb 12.8 oz (54.3 kg) (33 %, Z= -0.43)*   * Growth percentiles are based on CDC (Girls, 2-20 Years) data.    Physical Exam Vitals and nursing note reviewed.  Constitutional:      General: She is awake. She is not in acute distress.    Appearance: She is well-developed and well-groomed. She is not ill-appearing or toxic-appearing.  HENT:     Head: Normocephalic.     Right Ear: Hearing, ear canal and external  ear normal.     Left Ear: Hearing, ear canal and external ear normal.  Eyes:     General: Lids are normal.        Right eye: No discharge.        Left eye: No discharge.     Conjunctiva/sclera: Conjunctivae normal.     Pupils: Pupils are equal, round, and reactive to light.  Neck:     Thyroid: No thyromegaly.     Vascular: No carotid bruit.  Cardiovascular:     Rate and Rhythm: Normal rate and regular rhythm.     Heart sounds: Normal heart sounds. No murmur heard.   No gallop.  Pulmonary:     Effort: Pulmonary effort is normal. No accessory muscle usage or respiratory distress.     Breath sounds: Normal breath sounds.  Abdominal:     General: Bowel sounds are normal. There is no distension.     Palpations: Abdomen is soft. There is no hepatomegaly.     Tenderness: There is generalized abdominal tenderness. There is no right CVA tenderness or left CVA tenderness.  Musculoskeletal:     Cervical back: Normal range of motion and neck supple.     Right lower leg: No edema.     Left lower leg: No edema.  Feet:     Right foot:     Skin integrity: Dry skin present.     Left foot:     Skin integrity: Dry skin present.     Comments: Dry cracked skin, patchy, to bottom of feet. Skin:    General: Skin is warm and dry.  Neurological:     Mental Status: She is alert and oriented to person, place, and time.  Psychiatric:        Attention and Perception: Attention normal.        Mood and Affect: Mood normal.        Speech: Speech normal.        Behavior: Behavior normal. Behavior is cooperative.        Thought Content: Thought content normal.   Results for orders placed or performed in visit on 05/26/21  WET PREP FOR TRICH, YEAST, CLUE   Specimen: Sterile Swab   Sterile Swab  Result Value Ref Range   Trichomonas Exam Negative Negative   Yeast Exam Negative Negative   Clue Cell Exam Negative Negative  Microscopic Examination   Urine  Result Value Ref Range   WBC, UA 0-5 0 - 5  /hpf   RBC 0-2 0 - 2 /hpf   Epithelial Cells (non renal) 0-10 0 - 10 /hpf   Bacteria, UA Many (A) None seen/Few  Urinalysis, Routine w reflex microscopic  Result Value Ref Range   Specific Gravity, UA 1.025 1.005 - 1.030   pH, UA 6.5 5.0 - 7.5   Color, UA Yellow Yellow   Appearance Ur Cloudy (  A) Clear   Leukocytes,UA Trace (A) Negative   Protein,UA Negative Negative/Trace   Glucose, UA Negative Negative   Ketones, UA Negative Negative   RBC, UA Trace (A) Negative   Bilirubin, UA Negative Negative   Urobilinogen, Ur 1.0 0.2 - 1.0 mg/dL   Nitrite, UA Negative Negative   Microscopic Examination See below:       Assessment & Plan:   Problem List Items Addressed This Visit       Musculoskeletal and Integument   Tinea pedis    Acute and intermittent to bilateral feet.  Recommend use of OTC Lamisil spray.  Ensure well dried feet after shower or long periods on feet, then apply spray and allow to dry prior to placing socks or shoes on.  For ongoing or worsening, return to office.         Genitourinary   Asymptomatic microscopic hematuria - Primary    Acute with waxing and waning discomfort and urinary symptoms.  UA today noting trace blood and many bacteria, will send for culture.  Wet prep negative.  Will check urine for GC/Chlamydia.  Educated on kidney stones and kidney function, reviewed recent blood work with her.  Will send to urology per patient request, for reassurance and further assessment as patient very concerned about kidney stones and kidney function.  Obtain CT scan abd/pelvis to further assess.  Plan to return in September as scheduled for follow-up, sooner if worsening symptoms.  Blood work up to date from recent visit.       Relevant Orders   Urinalysis, Routine w reflex microscopic (Completed)   WET PREP FOR TRICH, YEAST, CLUE (Completed)   GC/Chlamydia Probe Amp   Urine Culture   CT Abdomen Pelvis Wo Contrast   Ambulatory referral to Urology     Follow up  plan: Return for as scheduled in September.  

## 2021-05-28 LAB — GC/CHLAMYDIA PROBE AMP
Chlamydia trachomatis, NAA: NEGATIVE
Neisseria Gonorrhoeae by PCR: NEGATIVE

## 2021-05-28 LAB — URINE CULTURE

## 2021-05-28 NOTE — Progress Notes (Signed)
Contacted via MyChart   Good morning Brenda Rangel, your gonorrhea and chlamydia testing has returned and is negative.  Good news!! Keep being stellar!!  Thank you for allowing me to participate in your care.  I appreciate you. Kindest regards, Gabryelle Whitmoyer

## 2021-06-04 ENCOUNTER — Ambulatory Visit
Admission: RE | Admit: 2021-06-04 | Discharge: 2021-06-04 | Disposition: A | Discharge status: Home / Self Care | Payer: Federal, State, Local not specified - PPO | Source: Ambulatory Visit | Attending: Nurse Practitioner | Admitting: Nurse Practitioner

## 2021-06-04 ENCOUNTER — Other Ambulatory Visit: Payer: Self-pay

## 2021-06-04 DIAGNOSIS — R3129 Other microscopic hematuria: Secondary | ICD-10-CM | POA: Diagnosis not present

## 2021-06-04 DIAGNOSIS — R3121 Asymptomatic microscopic hematuria: Secondary | ICD-10-CM | POA: Diagnosis not present

## 2021-06-04 DIAGNOSIS — M545 Low back pain, unspecified: Secondary | ICD-10-CM | POA: Diagnosis not present

## 2021-06-05 NOTE — Progress Notes (Signed)
Contacted via MyChart   Good morning Brenda Rangel, your imaging has returned and is overall reassuring with no kidney stones noted.  You do have some calcifications, build up, in appendix "appendicolith" but no inflammation of appendix, which is good news.  We do need to monitor though as sometimes this can lead to appendicitis.  I know you see GI on 06/16/21 and we will see their opinion as well on this -- whether we can monitor or if they recommend looking into surgery.  Any questions?  Have a good weekend. Keep being awesome!!  Thank you for allowing me to participate in your care.  I appreciate you. Kindest regards, Navie Lamoreaux

## 2021-06-10 NOTE — Progress Notes (Signed)
06/10/21 4:17 PM   Brenda Rangel 18-Oct-2001 [id number]  Referring provider:  Venita Lick, NP [address] [place],  [place] [address] No chief complaint on file.    HPI: Brenda Rangel is a 20 y.o.female who presents today for further evaluation of asymptomatic microscopic hematuria.   She was seen in the ED 05/21/2021 for lower urinary tract infection.  She reported lower abdominal pain without vaginal discharge or burning. The burning that she did experience was at the orifice of the urethra. Urine culture showed <10,000 colonies/mL insignificant growth. Urinalysis showed large Hgb dipstick, 100! Protein, moderate leukocytes, and few bacteria.  Prescribed antibiotics and improved with taking these medications.  She was called at day 5 and told that her urine culture was negative to stop the antibiotics although she had completed the course.  She was concerned about this and worried that she may have some underlying alternate conditions such as kidney stones.  She followed up with her primary care physician.  Her urine was rechecked on 05/26/2021 Urinalysis showed trace leukocytes and RBCs, with negative nitrite. Urine culture was unremarkable.  She was on antibiotics at this time.  06/05/2021 CT abdomen pelvis without contrast showed adrenal glands are unremarkable. Kidneys are normal, without renal calculi, focal lesion, or hydronephrosis. No ureteral calculi identified. Urinary bladder appears unremarkable for the degree of distension.  Urine today is negative and she reports having no symptoms today. She states she often starts having symptoms that are accompanied by odor.   She denies having fever with infection and issues with infection during childhood.   She reports that she had a documented infection back in April, E. coli.  She reports that at least every other month, she will some mild symptoms of urinary urgency frequency which within a few hours or less of the day, these  resolved spontaneously without antibiotics.   She is sexually active and sees no correlation with her sexual activity as it relates to her urinary symptoms.  No pain within the course.  No high risk sexual behaviors.  No febrile UITs or admissions.   PMH: Past Medical History:  Diagnosis Date   Abdominal migraine    Anxiety     Surgical History: Past Surgical History:  Procedure Laterality Date   NO PAST SURGERIES      Home Medications:  Allergies as of 06/11/2021   No Known Allergies      Medication List        Accurate as of June 10, 2021  4:17 PM. If you have any questions, ask your nurse or doctor.          busPIRone 5 MG tablet Commonly known as: BUSPAR Take 1 tablet (5 mg total) by mouth 2 (two) times daily.   FLUoxetine 40 MG capsule Commonly known as: PROzac Take 1 capsule (40 mg total) by mouth daily.   hydrOXYzine 10 MG tablet Commonly known as: ATARAX/VISTARIL Take 1 tablet (10 mg total) by mouth 3 (three) times daily as needed.   levonorgestrel-ethinyl estradiol 0.15-30 MG-MCG tablet Commonly known as: Lillow Take 1 tablet by mouth daily.        Allergies: No Known Allergies  Family History: Family History  Problem Relation Age of Onset   Anxiety disorder Mother    Depression Mother    Anxiety disorder Father    Depression Father    Pectus excavatum Sister    Breast cancer Maternal Grandmother    Pectus excavatum Adoptive Father  Social History:  reports that she has never smoked. She has never used smokeless tobacco. She reports current alcohol use. She reports that she does not use drugs.   Physical Exam: LMP 05/30/2021   Constitutional:  Alert and oriented, No acute distress. HEENT: [place] AT, moist mucus membranes.  Trachea midline, no masses. Cardiovascular: No clubbing, cyanosis, or edema. Respiratory: Normal respiratory effort, no increased work of breathing. Skin: No rashes, bruises or suspicious lesions. Neurologic:  Grossly intact, no focal deficits, moving all 4 extremities. Psychiatric: Normal mood and affect.  Laboratory Data:  Lab Results  Component Value Date   CREATININE 0.70 04/08/2021     Urinalysis Negative today   Pertinent Imaging: CLINICAL DATA:  Microhematuria.  Low back pain   EXAM: CT ABDOMEN AND PELVIS WITHOUT CONTRAST   TECHNIQUE: Multidetector CT imaging of the abdomen and pelvis was performed following the standard protocol without IV contrast.   COMPARISON:  09/12/2018   FINDINGS: Lower chest: Included lung bases are clear.  Heart size is normal.   Hepatobiliary: Unremarkable unenhanced appearance of the liver. No focal liver lesion identified. Gallbladder within normal limits. No hyperdense gallstone. No biliary dilatation.   Pancreas: Unremarkable. No pancreatic ductal dilatation or surrounding inflammatory changes.   Spleen: Normal in size without focal abnormality.   Adrenals/Urinary Tract: Adrenal glands are unremarkable. Kidneys are normal, without renal calculi, focal lesion, or hydronephrosis. No ureteral calculi identified. Urinary bladder appears unremarkable for the degree of distension.   Stomach/Bowel: Evaluation of bowel is somewhat limited in the absence of oral or IV contrast. Stomach is unremarkable. No dilated loops of bowel. Noninflamed appendix containing appendicolith (series 4, images 35-44). No focal bowel wall thickening or inflammatory changes.   Vascular/Lymphatic: No significant vascular findings are present. No enlarged abdominal or pelvic lymph nodes.   Reproductive: Uterus and bilateral adnexa are unremarkable.   Other: No free fluid. No abdominopelvic fluid collection. No pneumoperitoneum. No abdominal wall hernia.   Musculoskeletal: No acute or significant osseous findings.   IMPRESSION: 1. No acute abdominopelvic findings. Specifically, no evidence of nephrolithiasis or obstructive uropathy. 2. Noninflamed appendix  containing appendicolith.     Electronically Signed   By: Davina Poke D.O.   On: 06/05/2021 07:50  CT scan personally reviewed, agree with radiologic interpretation of images  Assessment & Plan:    Recurrent UTIs - Urinalysis today was negative  - Urged her to drink more fluids - Counseled her on benefit of cranberry tablets, probiotics (look for lactobacillus), and D-mannose for prevention of UTIs.  - Also advised her on behaviors to take after sexual intercourse to lower risk of UTIs -CT reviewed, no issues with upper tract pathology -No indication for further evaluation/treatment at this time  Follow-up as needed.   I,Kailey Littlejohn,acting as a Education administrator for Hollice Espy, MD.,have documented all relevant documentation on the behalf of Hollice Espy, MD,as directed by  Hollice Espy, MD while in the presence of Hollice Espy, MD.  I have reviewed the above documentation for accuracy and completeness, and I agree with the above.   Hollice Espy, MD   [facility] [address], [place], [place] [address] [phone]

## 2021-06-11 ENCOUNTER — Ambulatory Visit: Payer: Federal, State, Local not specified - PPO | Admitting: Urology

## 2021-06-11 ENCOUNTER — Encounter: Payer: Self-pay | Admitting: Urology

## 2021-06-11 ENCOUNTER — Other Ambulatory Visit: Payer: Self-pay

## 2021-06-11 VITALS — BP 119/81 | HR 97 | Ht 63.0 in | Wt 116.0 lb

## 2021-06-11 DIAGNOSIS — R3129 Other microscopic hematuria: Secondary | ICD-10-CM | POA: Diagnosis not present

## 2021-06-11 NOTE — Patient Instructions (Addendum)
OTC Cranberry tablets, probiotics and D-mannose as directed.

## 2021-06-12 LAB — URINALYSIS, COMPLETE
Bilirubin, UA: NEGATIVE
Glucose, UA: NEGATIVE
Ketones, UA: NEGATIVE
Nitrite, UA: NEGATIVE
Protein,UA: NEGATIVE
Specific Gravity, UA: 1.02 (ref 1.005–1.030)
Urobilinogen, Ur: 1 mg/dL (ref 0.2–1.0)
pH, UA: 7.5 (ref 5.0–7.5)

## 2021-06-12 LAB — MICROSCOPIC EXAMINATION

## 2021-06-16 ENCOUNTER — Ambulatory Visit: Payer: Federal, State, Local not specified - PPO | Admitting: Gastroenterology

## 2021-06-16 ENCOUNTER — Other Ambulatory Visit: Payer: Self-pay

## 2021-06-16 VITALS — BP 116/73 | HR 89 | Temp 99.4°F | Ht 63.0 in | Wt 120.8 lb

## 2021-06-16 DIAGNOSIS — R195 Other fecal abnormalities: Secondary | ICD-10-CM

## 2021-06-16 DIAGNOSIS — R109 Unspecified abdominal pain: Secondary | ICD-10-CM

## 2021-06-16 MED ORDER — PSYLLIUM 58.6 % PO POWD: 1.0000 | Freq: Every day | ORAL | 0 refills | Status: AC

## 2021-06-16 NOTE — Progress Notes (Signed)
Brenda Rangel Brenda Rangel  [place], [place] [address]  Main: [phone]  Fax: [phone]   Gastroenterology Consultation  Referring Provider:     Venita Lick, NP Primary Care Physician:  Venita Lick, NP Reason for Consultation:     Abdominal pain        HPI:    Chief Complaint  Patient presents with   New Patient (Initial Visit)    Denies family Hx   Abdominal Pain    abdominal pain mostly in the AM with an increase urge to have to have BM, reports the stools are loose not watery... Sx have improved since x 2 months ago...  now intermittent BM have gotten more solid... abd pain has improved... Had nausea and vomiting before, not now... Denies blood in stools...    Brenda Rangel is a 20 y.o. y/o female referred for consultation & management  by Dr. Venita Lick, NP.  Patient states when she made this appointment about 2 months ago, she was having abdominal pain and the consistency of her stool had changed to loose stools.  However, since then her symptoms have improved.  She reports that she was previously having bilateral lower quadrant abdominal pain, dull, 5/10, nonradiating.  This has mostly resolved.  During this time she reports going to urgent care, PCP, and urology and is not quite sure if she had UTIs, as she was given antibiotics, and then told to stop antibiotics.  Urology note by Dr. Hollice Espy from 06/11/2021 reviewed and assessment and plan reports recurrent UTIs, with urinalysis on that visit being negative.  They urged her to drink more fluids.  Patient did complete 5 days of antibiotics  Describes that her bowel movement consistently changed to loose stool, but she only had 1 loose stools a day since her symptoms started.  These have started to become more formed, but are not back to baseline yet.  She never had multiple bowel movements a day.  TTG IgA - June 2022.  Antigliadin IgA, gliadin IgG negative  CMP normal  June 2022.  CBC reassuring June 2022 Normal iron, ferritin panel June 2022  Past Medical History:  Diagnosis Date   Abdominal migraine    Anxiety     Past Surgical History:  Procedure Laterality Date   NO PAST SURGERIES      Prior to Admission medications   Medication Sig Start Date End Date Taking? Authorizing Provider  busPIRone (BUSPAR) 5 MG tablet Take 1 tablet (5 mg total) by mouth 2 (two) times daily. 05/08/21  Yes Cannady, Jolene T, NP  FLUoxetine (PROZAC) 40 MG capsule Take 1 capsule (40 mg total) by mouth daily. 02/29/20  Yes Cannady, Jolene T, NP  hydrOXYzine (ATARAX/VISTARIL) 10 MG tablet Take 1 tablet (10 mg total) by mouth 3 (three) times daily as needed. 11/16/19  Yes Cannady, Henrine Screws T, NP  levonorgestrel-ethinyl estradiol (LILLOW) 0.15-30 MG-MCG tablet Take 1 tablet by mouth daily. 04/08/21  Yes Venita Lick, NP    Family History  Problem Relation Age of Onset   Anxiety disorder Mother    Depression Mother    Anxiety disorder Father    Depression Father    Pectus excavatum Sister    Breast cancer Maternal Grandmother    Pectus excavatum Adoptive Father      Social History   Tobacco Use   Smoking status: Never   Smokeless tobacco: Never  Vaping Use   Vaping Use: Never used  Substance Use Topics   Alcohol use: Yes    Comment: soccially   Drug use: Never    Allergies as of 06/16/2021   (No Known Allergies)    Review of Systems:    All systems reviewed and negative except where noted in HPI.   Physical Exam:  Constitutional: General:   Alert,  Well-developed, well-nourished, pleasant and cooperative in NAD BP 116/73   Pulse 89   Temp 99.4 F (37.4 C) (Oral)   Ht '5\' 3"'$  (1.6 m)   Wt 120 lb 12.8 oz (54.8 kg)   LMP 05/30/2021   BMI 21.40 kg/m   Eyes:  Sclera clear, no icterus.   Conjunctiva pink. PERRLA  Ears:  No scars, lesions or masses, Normal auditory acuity. Nose:  No deformity, discharge, or lesions. Mouth:  No deformity or lesions,  oropharynx pink & moist.  Neck:  Supple; no masses or thyromegaly.  Respiratory: Normal respiratory effort, Normal percussion  Gastrointestinal: Soft, non-tender and non-distended without masses, hepatosplenomegaly or hernias noted.  No guarding or rebound tenderness.     Cardiac: No clubbing or edema.  No cyanosis. Normal posterior tibial pedal pulses noted.  Lymphatic:  No significant cervical or axillary adenopathy.  Psych:  Alert and cooperative. Normal mood and affect.  Musculoskeletal:  Normal gait. Head normocephalic, atraumatic. Symmetrical without gross deformities. 5/5 Upper and Lower extremity strength bilaterally.  Skin: Warm. Intact without significant lesions or rashes. No jaundice.  Neurologic:  Face symmetrical, tongue midline, Normal sensation to touch;  grossly normal neurologically.  Psych:  Alert and oriented x3, Alert and cooperative. Normal mood and affect.   Labs: CBC    Component Value Date/Time   WBC 7.5 04/08/2021 1630   WBC 8.8 09/06/2019 1144   RBC 4.93 04/08/2021 1630   RBC 4.79 09/06/2019 1144   HGB 14.7 04/08/2021 1630   HCT 43.7 04/08/2021 1630   PLT 271 04/08/2021 1630   MCV 89 04/08/2021 1630   MCH 29.8 04/08/2021 1630   MCH 29.0 09/06/2019 1144   MCHC 33.6 04/08/2021 1630   MCHC 34.5 09/06/2019 1144   RDW 11.6 (L) 04/08/2021 1630   LYMPHSABS 2.7 04/08/2021 1630   EOSABS 0.1 04/08/2021 1630   BASOSABS 0.1 04/08/2021 1630   CMP     Component Value Date/Time   NA 139 04/08/2021 1630   K 4.0 04/08/2021 1630   CL 101 04/08/2021 1630   CO2 24 04/08/2021 1630   GLUCOSE 87 04/08/2021 1630   GLUCOSE 107 (H) 09/06/2019 1144   BUN 8 04/08/2021 1630   CREATININE 0.70 04/08/2021 1630   CALCIUM 9.6 04/08/2021 1630   PROT 7.1 04/08/2021 1630   ALBUMIN 4.7 04/08/2021 1630   AST 20 04/08/2021 1630   ALT 13 04/08/2021 1630   ALKPHOS 50 04/08/2021 1630   BILITOT 0.6 04/08/2021 1630   GFRNONAA >60 09/06/2019 1144   GFRAA >60 09/06/2019  1144    Imaging Studies: IMPRESSION: 1. No acute abdominopelvic findings. Specifically, no evidence of nephrolithiasis or obstructive uropathy. 2. Noninflamed appendix containing appendicolith.     Electronically Signed   By: Davina Poke D.O.  Assessment and Plan:   Brenda Rangel is a 20 y.o. y/o female has been referred for abdominal pain  Given that the location of her abdominal pain was in the bilateral lower quadrant and patient also had UTI around that time, symptoms were likely related to her UTI.  Symptoms have already improved since her course of antibiotics  She has had reassuring  work-up, with no anemia on CBC, normal liver enzymes and electrolytes on CMP, and reassuring CT scan, and other reassuring labs as per HPI  No indication for colonoscopy at this time  However, given the change in the consistency of her stool, will check fecal calprotectin and if abnormal, proceed with colonoscopy.  If fecal calprotectin is normal and then symptoms continue to improve as they already are, patient does not need endoscopic interventions at this time  If symptoms change, recur or worsen, patient advised to call us back and she is agreeable with this plan  Patient can start using Metamucil to help bulk stool, and use it for 3 to 4 weeks, until bowel movement consistency is back to normal.  Currently she is only having 1 bowel movement a day, and therefore does not need an antidiarrheal  Dr Brenda Rangel  Speech recognition software was used to dictate the above note.

## 2021-06-17 ENCOUNTER — Other Ambulatory Visit: Payer: Self-pay | Admitting: Gastroenterology

## 2021-06-17 DIAGNOSIS — R195 Other fecal abnormalities: Secondary | ICD-10-CM | POA: Diagnosis not present

## 2021-06-19 ENCOUNTER — Encounter: Payer: Self-pay | Admitting: Nurse Practitioner

## 2021-06-19 ENCOUNTER — Ambulatory Visit: Payer: Federal, State, Local not specified - PPO | Admitting: Nurse Practitioner

## 2021-06-19 ENCOUNTER — Other Ambulatory Visit: Payer: Self-pay

## 2021-06-19 VITALS — BP 101/66 | HR 88 | Temp 98.9°F | Wt 118.0 lb

## 2021-06-19 DIAGNOSIS — N390 Urinary tract infection, site not specified: Secondary | ICD-10-CM | POA: Diagnosis not present

## 2021-06-19 DIAGNOSIS — R634 Abnormal weight loss: Secondary | ICD-10-CM | POA: Diagnosis not present

## 2021-06-19 DIAGNOSIS — F411 Generalized anxiety disorder: Secondary | ICD-10-CM

## 2021-06-19 LAB — CALPROTECTIN, FECAL: Calprotectin, Fecal: 59 ug/g (ref 0–120)

## 2021-06-19 MED ORDER — NITROFURANTOIN MONOHYD MACRO 100 MG PO CAPS: ORAL_CAPSULE | ORAL | 1 refills | Status: DC

## 2021-06-19 NOTE — Progress Notes (Signed)
BP 101/66   Pulse 88   Temp 98.9 F (37.2 C) (Oral)   Wt 118 lb (53.5 kg)   LMP 05/30/2021   SpO2 98%   BMI 20.90 kg/m    Subjective:    Patient ID: Brenda Rangel, female    DOB: [birth year], 20 y.o.   MRN: [id number]  HPI: Brenda Rangel is a 20 y.o. female  Chief Complaint  Patient presents with   Hematuria    Patient states she felt she did not learn or "get anything" from her visit, states the doctor she saw was very dismissive. Patient states she has seen GI as well and she had to perform a stool sample and states it was a little over the normal range. Patient states she is here for a follow up on everything to discuss with provider.    URINARY SYMPTOMS Saw urology on 06/11/21 for hematuria, recent CT abdomen overall reassuring on 06/04/21.  Has history of chlamydia one year ago which was treated.  She is concerned about recurrent UTI, urology recommended behavioral changes and probiotic/cranberry tablets daily.  Reports a poor experience with urology and feeling like she had no answers.    She saw GI on 06/16/21, and fecal calprotectin ordered, if abnormal plan is to perform colonoscopy.  She returns to see GI on 09/17/21.   Dysuria: no Urinary frequency: no Urgency: none Small volume voids: no Symptom severity: none Urinary incontinence: no Foul odor: none Hematuria: none Abdominal pain: none Back pain: none Suprapubic pain/pressure: no Flank pain: none Fever:  no Vomiting: none Status: stable Previous urinary tract infection: yes Recurrent urinary tract infection: no Sexual activity: monogamous History of sexually transmitted disease: yes Treatments attempted: increasing fluids     ANXIETY/STRESS Taking Prozac 40 MG and Buspar 5 MG BID.  She is in [facility] dental assistance program.  Has not been taking Vistaril.  Has tried Zoloft in past with no benefit. She is attempting to get back into [facility], saw them last beginning of this year.  She reports more  depressive symptoms recently at times.   Duration: ongoing Anxious mood: yes Excessive worrying: yes Irritability: yes Sweating: no Nausea: no Palpitations: occasional with anxiety Hyperventilation: no Panic attacks: x [facility] Agoraphobia: no  Obscessions/compulsions: occasional Depressed mood: no Depression screen [facility] 2/9 06/19/2021 05/08/2021 04/08/2021 01/26/2021 12/17/2020  Decreased Interest 0 0 0 0 2  Down, Depressed, Hopeless 0 0 0 0 2  PHQ - 2 Score 0 0 0 0 4  Altered sleeping 1 1 0 0 2  Tired, decreased energy 1 1 0 0 0  Change in appetite 0 0 0 0 2  Feeling bad or failure about yourself  0 0 0 0 0  Trouble concentrating 0 '1 1 2 2  '$ Moving slowly or fidgety/restless 0 0 1 2 0  Suicidal thoughts 0 0 0 0 0  PHQ-9 Score '2 3 2 4 10  '$ Difficult doing work/chores Not difficult at all Not difficult at all Somewhat difficult - Somewhat difficult  Some recent data might be hidden  Anhedonia: no Weight changes: no Insomnia: yes hard to stay asleep  Hypersomnia: no Fatigue/loss of energy: no Feelings of worthlessness: no Feelings of guilt: no Impaired concentration/indecisiveness: yes Suicidal ideations: no  Crying spells: no Recent Stressors/Life Changes: yes   Relationship problems: no   Family stress: yes     Financial stress: no    Job stress: no    Recent death/loss: no  GAD 7 : Generalized Anxiety Score 06/19/2021 05/08/2021 04/08/2021 01/26/2021  Nervous, Anxious, on Edge '2 3 1 1  '$ Control/stop worrying '2 2 1 1  '$ Worry too much - different things '2 2 1 1  '$ Trouble relaxing 1 2 0 0  Restless '2 2 2 2  '$ Easily annoyed or irritable '2 3 1 1  '$ Afraid - awful might happen '2 1 1 '$ 0  Total GAD 7 Score '13 15 7 6  '$ Anxiety Difficulty Somewhat difficult Very difficult Somewhat difficult -   Relevant past medical, surgical, family and social history reviewed and updated as indicated. Interim medical history since our last visit reviewed. Allergies and medications reviewed  and updated.  Review of Systems  Constitutional:  Negative for activity change, appetite change, diaphoresis, fatigue and fever.  Respiratory:  Negative for cough, chest tightness and shortness of breath.   Cardiovascular:  Negative for chest pain, palpitations and leg swelling.  Psychiatric/Behavioral:  Negative for decreased concentration, self-injury, sleep disturbance and suicidal ideas. The patient is nervous/anxious.    Per HPI unless specifically indicated above     Objective:    BP 101/66   Pulse 88   Temp 98.9 F (37.2 C) (Oral)   Wt 118 lb (53.5 kg)   LMP 05/30/2021   SpO2 98%   BMI 20.90 kg/m   Wt Readings from Last 3 Encounters:  06/19/21 118 lb (53.5 kg)  06/16/21 120 lb 12.8 oz (54.8 kg)  06/11/21 116 lb (52.6 kg)    Physical Exam Vitals and nursing note reviewed.  Constitutional:      General: She is awake. She is not in acute distress.    Appearance: She is well-developed and well-groomed. She is not ill-appearing.  HENT:     Head: Normocephalic.     Right Ear: Hearing normal.     Left Ear: Hearing normal.     Mouth/Throat:     Mouth: Mucous membranes are moist.     Dentition: Normal dentition. No gingival swelling.  Eyes:     General: Lids are normal.        Right eye: No discharge.        Left eye: No discharge.     Conjunctiva/sclera: Conjunctivae normal.     Pupils: Pupils are equal, round, and reactive to light.  Neck:     Thyroid: No thyromegaly.     Vascular: No carotid bruit.  Cardiovascular:     Rate and Rhythm: Normal rate and regular rhythm.     Heart sounds: Normal heart sounds. No murmur heard.   No gallop.  Pulmonary:     Effort: Pulmonary effort is normal. No accessory muscle usage or respiratory distress.     Breath sounds: Normal breath sounds.  Abdominal:     General: Bowel sounds are normal. There is no distension.     Palpations: Abdomen is soft. There is no hepatomegaly.     Tenderness: There is no abdominal tenderness.  There is no right CVA tenderness or left CVA tenderness.  Musculoskeletal:     Cervical back: Normal range of motion and neck supple.     Right lower leg: No edema.     Left lower leg: No edema.  Skin:    General: Skin is warm and dry.  Neurological:     Mental Status: She is alert and oriented to person, place, and time.  Psychiatric:        Attention and Perception: Attention normal.  Mood and Affect: Mood normal.        Speech: Speech normal.        Behavior: Behavior normal. Behavior is cooperative.        Thought Content: Thought content normal.   Results for orders placed or performed in visit on 06/17/21  Calprotectin, Fecal  Result Value Ref Range   Calprotectin, Fecal 59 0 - 120 ug/g      Assessment & Plan:   Problem List Items Addressed This Visit       Genitourinary   Frequent UTI - Primary    At this time asymptomatic.  Had reassuring visit with urology -- to return as needed.  Recommend she follow suggestions provider by them - start probiotic and cranberry tablets daily.  Will send in [place] to use as needed post intercourse for prevention.      Relevant Medications   nitrofurantoin, macrocrystal-monohydrate, (MACROBID) 100 MG capsule     Other   Generalized anxiety disorder    Ongoing, stable.  Denies SI/HI.  Continue Prozac 40 MG and add on Buspar 5 MG BID, adjust as needed, educated her on this medication and possible side effects.  Continue Vistaril as needed for SEVERE anxiety episodes.  Educated on medications.  Recommend meditation and relaxation exercises at home + return to therapy sessions.  Therapy should offer benefit to overall mood.  Return in 6 months.      Unintentional weight loss    Maintaining weight at this time, will continue collaboration with GI provider.  At this time recommend she continue recommendations by them of taking Metamucil daily.        Follow up plan: Return in about 6 months (around 12/17/2021) for MOOD.

## 2021-06-19 NOTE — Assessment & Plan Note (Signed)
Ongoing, stable.  Denies SI/HI.  Continue Prozac 40 MG and add on Buspar 5 MG BID, adjust as needed, educated her on this medication and possible side effects.  Continue Vistaril as needed for SEVERE anxiety episodes.  Educated on medications.  Recommend meditation and relaxation exercises at home + return to therapy sessions.  Therapy should offer benefit to overall mood.  Return in 6 months.

## 2021-06-19 NOTE — Assessment & Plan Note (Signed)
Maintaining weight at this time, will continue collaboration with GI provider.  At this time recommend she continue recommendations by them of taking Metamucil daily.

## 2021-06-19 NOTE — Patient Instructions (Signed)

## 2021-06-19 NOTE — Assessment & Plan Note (Signed)
At this time asymptomatic.  Had reassuring visit with urology -- to return as needed.  Recommend she follow suggestions provider by them - start probiotic and cranberry tablets daily.  Will send in [place] to use as needed post intercourse for prevention.

## 2021-07-15 DIAGNOSIS — R195 Other fecal abnormalities: Secondary | ICD-10-CM | POA: Diagnosis not present

## 2021-07-24 LAB — CALPROTECTIN, FECAL: Calprotectin, Fecal: 108 ug/g (ref 0–120)

## 2021-07-29 ENCOUNTER — Other Ambulatory Visit: Payer: Self-pay

## 2021-07-29 DIAGNOSIS — F330 Major depressive disorder, recurrent, mild: Secondary | ICD-10-CM | POA: Diagnosis not present

## 2021-07-29 DIAGNOSIS — F411 Generalized anxiety disorder: Secondary | ICD-10-CM | POA: Diagnosis not present

## 2021-07-29 MED ORDER — NA SULFATE-K SULFATE-MG SULF 17.5-3.13-1.6 GM/177ML PO SOLN: 1.0000 | Freq: Once | ORAL | 0 refills | Status: AC

## 2021-07-30 ENCOUNTER — Other Ambulatory Visit: Payer: Self-pay | Admitting: Gastroenterology

## 2021-07-30 DIAGNOSIS — K529 Noninfective gastroenteritis and colitis, unspecified: Secondary | ICD-10-CM

## 2021-07-31 ENCOUNTER — Encounter: Payer: Self-pay | Admitting: Nurse Practitioner

## 2021-07-31 ENCOUNTER — Other Ambulatory Visit: Payer: Self-pay

## 2021-07-31 ENCOUNTER — Ambulatory Visit: Payer: Self-pay

## 2021-07-31 ENCOUNTER — Ambulatory Visit: Payer: Federal, State, Local not specified - PPO | Admitting: Nurse Practitioner

## 2021-07-31 VITALS — BP 117/80 | HR 92 | Temp 98.4°F | Ht 62.99 in | Wt 121.2 lb

## 2021-07-31 DIAGNOSIS — K921 Melena: Secondary | ICD-10-CM | POA: Diagnosis not present

## 2021-07-31 DIAGNOSIS — M2550 Pain in unspecified joint: Secondary | ICD-10-CM

## 2021-07-31 NOTE — Telephone Encounter (Signed)
Patient called and says she had one black stool today. She says her abdomin was hurting and she had the BM, now the abdominal pain is not that bad. She says it's normal for her to have abdominal pain with what she has going on, so she's not too concerned about that. She says the stool was black and when she wiped it was brown on the tissue. Denies any other symptoms. Advised no availability with PCP, appointment scheduled for today at 1400 with Vance Peper, DNP, care advice given, patient verbalized understanding.   Reason for Disposition  MILD rectal bleeding (more than just a few drops or streaks)  Answer Assessment - Initial Assessment Questions 1. COLOR: "What color is it?" "Is that color in part or all of the stool?"     Black 2. ONSET: "When was the unusual color first noted?"     This morning 3. CAUSE: "Have you eaten any food or taken any medicine of this color?" (See listing in BACKGROUND)     No 4. OTHER SYMPTOMS: "Do you have any other symptoms?" (e.g., diarrhea, jaundice, abdominal pain, fever).     Slight abdominal pain (normal)  Answer Assessment - Initial Assessment Questions 1. APPEARANCE of BLOOD: "What color is it?" "Is it passed separately, on the surface of the stool, or mixed in with the stool?"      Black 2. AMOUNT: "How much blood was passed?"      The entire stool was black 3. FREQUENCY: "How many times has blood been passed with the stools?"      1 time 4. ONSET: "When was the blood first seen in the stools?" (Days or weeks)      Today 5. DIARRHEA: "Is there also some diarrhea?" If Yes, ask: "How many diarrhea stools in the past 24 hours?"      No 6. CONSTIPATION: "Do you have constipation?" If Yes, ask: "How bad is it?"     No 7. RECURRENT SYMPTOMS: "Have you had blood in your stools before?" If Yes, ask: "When was the last time?" and "What happened that time?"      No 8. BLOOD THINNERS: "Do you take any blood thinners?" (e.g., Coumadin/warfarin,  Pradaxa/dabigatran, aspirin)     No 9. OTHER SYMPTOMS: "Do you have any other symptoms?"  (e.g., abdomen pain, vomiting, dizziness, fever)     Mild abdominal pain 10. PREGNANCY: "Is there any chance you are pregnant?" "When was your last menstrual period?"       No, LMP last week  Protocols used: Stools - Unusual Color-A-AH, Rectal Bleeding-A-AH

## 2021-07-31 NOTE — Patient Instructions (Signed)
Voltaren gel over the counter up to 4 times as needed for pain for your knee or shoulder

## 2021-07-31 NOTE — Progress Notes (Signed)
Acute Office Visit  Subjective:    Patient ID: Brenda Rangel, female    DOB: 07-15-2001, 20 y.o.   MRN: [id number]  Chief Complaint  Patient presents with   Abdominal Pain    Being nausea, still having Abd pain.    dark stool    Patient states that she had a bowel movement that was Pitch Black and firm,   Joint Pain    Patient also states that she has been having joint pain in her left shoulder and left ankle.    HPI Patient is in today for black stool today. She had a solid bowel movement this morning, that was black in color. She denies bright red blood, blood on toilet paper, fevers, and taking pepto bismol or iron. She has been having stomach pain every morning since June. The pain is relieved when she has a bowel movement. For the past 3-4 days she has also been experiencing left shoulder and right knee pain. She feels like her right knee is stiff. She has not tried any medications at home. She denies taking frequent NSAIDs including ibuprofen, naproxen, or aleve. She thinks she may have ulcerative colitiis. She saw GI a few weeks ago and is scheduled for a colonoscopy in December.    Past Medical History:  Diagnosis Date   Abdominal migraine    Anxiety     Past Surgical History:  Procedure Laterality Date   NO PAST SURGERIES      Family History  Problem Relation Age of Onset   Anxiety disorder Mother    Depression Mother    Anxiety disorder Father    Depression Father    Pectus excavatum Sister    Breast cancer Maternal Grandmother    Pectus excavatum Adoptive Father     Social History   Socioeconomic History   Marital status: Single    Spouse name: Not on file   Number of children: Not on file   Years of education: Not on file   Highest education level: Not on file  Occupational History    Comment: [employer] --- associates in science  Tobacco Use   Smoking status: Never   Smokeless tobacco: Never  Vaping Use   Vaping Use: Never used  Substance and Sexual  Activity   Alcohol use: Yes    Comment: soccially   Drug use: Never   Sexual activity: Yes    Birth control/protection: Pill    Comment: monogamous  Other Topics Concern   Not on file  Social History Narrative   Not on file   Social Determinants of Health   Financial Resource Strain: Not on file  Food Insecurity: Not on file  Transportation Needs: Not on file  Physical Activity: Not on file  Stress: Not on file  Social Connections: Not on file  Intimate Partner Violence: Not on file    Outpatient Medications Prior to Visit  Medication Sig Dispense Refill   busPIRone (BUSPAR) 5 MG tablet Take 1 tablet (5 mg total) by mouth 2 (two) times daily. 180 tablet 4   FLUoxetine (PROZAC) 40 MG capsule Take 1 capsule (40 mg total) by mouth daily. 90 capsule 5   hydrOXYzine (ATARAX/VISTARIL) 10 MG tablet Take 1 tablet (10 mg total) by mouth 3 (three) times daily as needed. 30 tablet 0   levonorgestrel-ethinyl estradiol (LILLOW) 0.15-30 MG-MCG tablet Take 1 tablet by mouth daily. 28 tablet 11   nitrofurantoin, macrocrystal-monohydrate, (MACROBID) 100 MG capsule Take 100 MG (one tablet) by mouth after  intercourse for prevention. 40 capsule 1   No facility-administered medications prior to visit.    No Known Allergies  Review of Systems  Constitutional: Negative.   Respiratory: Negative.    Cardiovascular: Negative.   Gastrointestinal:  Positive for abdominal pain.       Black stool this morning  Genitourinary: Negative.   Musculoskeletal:  Positive for arthralgias (left shoulder, right knee pain).  Skin: Negative.   Neurological: Negative.   Psychiatric/Behavioral:  The patient is nervous/anxious.       Objective:    Physical Exam Vitals and nursing note reviewed. Exam conducted with a chaperone present.  Constitutional:      General: She is not in acute distress.    Appearance: Normal appearance.  HENT:     Head: Normocephalic.  Eyes:     Conjunctiva/sclera: Conjunctivae  normal.  Cardiovascular:     Rate and Rhythm: Normal rate and regular rhythm.     Pulses: Normal pulses.     Heart sounds: Normal heart sounds.  Pulmonary:     Effort: Pulmonary effort is normal.     Breath sounds: Normal breath sounds.  Abdominal:     Palpations: Abdomen is soft.     Tenderness: There is no abdominal tenderness.  Genitourinary:    Rectum: Normal. Guaiac result negative.     Comments: No hemorrhoids appreciated on exam Musculoskeletal:        General: Normal range of motion.     Cervical back: Normal range of motion.  Skin:    General: Skin is warm.  Neurological:     General: No focal deficit present.     Mental Status: She is alert and oriented to person, place, and time.  Psychiatric:        Mood and Affect: Mood normal.        Behavior: Behavior normal.        Thought Content: Thought content normal.        Judgment: Judgment normal.    BP 117/80   Pulse 92   Temp 98.4 F (36.9 C) (Oral)   Ht 5' 2.99" (1.6 m)   Wt 121 lb 3.2 oz (55 kg)   SpO2 97%   BMI 21.48 kg/m  Wt Readings from Last 3 Encounters:  07/31/21 121 lb 3.2 oz (55 kg)  06/19/21 118 lb (53.5 kg)  06/16/21 120 lb 12.8 oz (54.8 kg)    Health Maintenance Due  Topic Date Due   TETANUS/TDAP  07/14/2021    There are no preventive care reminders to display for this patient.   Lab Results  Component Value Date   TSH 0.981 04/08/2021   Lab Results  Component Value Date   WBC 7.5 04/08/2021   HGB 14.7 04/08/2021   HCT 43.7 04/08/2021   MCV 89 04/08/2021   PLT 271 04/08/2021   Lab Results  Component Value Date   NA 139 04/08/2021   K 4.0 04/08/2021   CO2 24 04/08/2021   GLUCOSE 87 04/08/2021   BUN 8 04/08/2021   CREATININE 0.70 04/08/2021   BILITOT 0.6 04/08/2021   ALKPHOS 50 04/08/2021   AST 20 04/08/2021   ALT 13 04/08/2021   PROT 7.1 04/08/2021   ALBUMIN 4.7 04/08/2021   CALCIUM 9.6 04/08/2021   ANIONGAP 11 09/06/2019   EGFR 128 04/08/2021   Lab Results   Component Value Date   CHOL 150 08/28/2019   Lab Results  Component Value Date   HDL 48 08/28/2019   Lab Results  Component Value Date   LDLCALC 74 08/28/2019   Lab Results  Component Value Date   TRIG 163 (H) 08/28/2019   No results found for: CHOLHDL No results found for: HGBA1C     Assessment & Plan:   Problem List Items Addressed This Visit   None Visit Diagnoses     Black stool    -  Primary   Hemocult in office negative. No hemorrhoids palpated on exam. Continue to monitor color of stools. F/U with GI if ongoing black stools   Arthralgia, unspecified joint       Left shoulder and right knee pain and tightness. Can try OTC voltaren gel prn. Recommend avoiding ibuprofen with stomach pain.         No orders of the defined types were placed in this encounter.    Charyl Dancer, NP

## 2021-08-03 NOTE — Telephone Encounter (Signed)
Patient was seen 07/31/2021 for this issue, nothing further needed.

## 2021-08-06 ENCOUNTER — Telehealth: Payer: Self-pay | Admitting: Nurse Practitioner

## 2021-08-06 NOTE — Telephone Encounter (Signed)
Copied from [place] [id number]. Topic: Appointment Scheduling - Scheduling Inquiry for Clinic >> Aug 05, 2021  3:39 PM Yvette Rack wrote: Reason for CRM: Pt stated she needs to have Tdap, polio, and flu vaccines done. Pt requests call back to schedule. Cb# [phone]

## 2021-08-10 NOTE — Telephone Encounter (Signed)
Appointment has been scheduled.

## 2021-08-13 ENCOUNTER — Ambulatory Visit
Admission: RE | Admit: 2021-08-13 | Discharge: 2021-08-13 | Disposition: A | Discharge status: Home / Self Care | Payer: Federal, State, Local not specified - PPO | Source: Ambulatory Visit | Attending: Nurse Practitioner | Admitting: Nurse Practitioner

## 2021-08-13 ENCOUNTER — Ambulatory Visit
Admission: RE | Admit: 2021-08-13 | Discharge: 2021-08-13 | Disposition: A | Discharge status: Home / Self Care | Payer: Federal, State, Local not specified - PPO | Attending: Nurse Practitioner | Admitting: Nurse Practitioner

## 2021-08-13 ENCOUNTER — Encounter: Payer: Self-pay | Admitting: Nurse Practitioner

## 2021-08-13 ENCOUNTER — Ambulatory Visit: Payer: Federal, State, Local not specified - PPO | Admitting: Nurse Practitioner

## 2021-08-13 ENCOUNTER — Other Ambulatory Visit: Payer: Self-pay

## 2021-08-13 ENCOUNTER — Ambulatory Visit: Payer: Federal, State, Local not specified - PPO

## 2021-08-13 VITALS — BP 101/68 | HR 102 | Temp 98.4°F | Wt 121.4 lb

## 2021-08-13 DIAGNOSIS — M25561 Pain in right knee: Secondary | ICD-10-CM | POA: Diagnosis not present

## 2021-08-13 DIAGNOSIS — Z23 Encounter for immunization: Secondary | ICD-10-CM | POA: Diagnosis not present

## 2021-08-13 NOTE — Progress Notes (Signed)
Acute Office Visit  Subjective:    Patient ID: Brenda Rangel, female    DOB: 2000/10/20, 20 y.o.   MRN: [id number]  Chief Complaint  Patient presents with   Knee Pain    Pt states she has been having R knee pain for about the last month. States it has gradually gotten worse. States it seems to hurt worse in the morning and in the evenings. States it mainly hurts while bending the knee.     HPI Patient is in today for right knee pain for several weeks.   KNEE PAIN  Duration: weeks Involved knee: right Mechanism of injury: unknown Location:anterior Onset: gradual Severity: 5/10  Quality:  sharp, pulling, and sore Frequency: constant Radiation: no Aggravating factors: bending  Alleviating factors: ice, NSAIDs, and brace  Status: worse Treatments attempted: rest and ice , voltaren gel Relief with NSAIDs?:  mild Weakness with weight bearing or walking: yes Sensation of giving way: no Locking: no Popping: no Bruising: no Swelling: no Redness: no Paresthesias/decreased sensation: no Fevers: no   Past Medical History:  Diagnosis Date   Abdominal migraine    Anxiety     Past Surgical History:  Procedure Laterality Date   NO PAST SURGERIES      Family History  Problem Relation Age of Onset   Anxiety disorder Mother    Depression Mother    Anxiety disorder Father    Depression Father    Pectus excavatum Sister    Breast cancer Maternal Grandmother    Pectus excavatum Adoptive Father     Social History   Socioeconomic History   Marital status: Single    Spouse name: Not on file   Number of children: Not on file   Years of education: Not on file   Highest education level: Not on file  Occupational History    Comment: [employer] --- associates in science  Tobacco Use   Smoking status: Never   Smokeless tobacco: Never  Vaping Use   Vaping Use: Never used  Substance and Sexual Activity   Alcohol use: Yes    Comment: soccially   Drug use: Never   Sexual  activity: Yes    Birth control/protection: Pill    Comment: monogamous  Other Topics Concern   Not on file  Social History Narrative   Not on file   Social Determinants of Health   Financial Resource Strain: Not on file  Food Insecurity: Not on file  Transportation Needs: Not on file  Physical Activity: Not on file  Stress: Not on file  Social Connections: Not on file  Intimate Partner Violence: Not on file    Outpatient Medications Prior to Visit  Medication Sig Dispense Refill   busPIRone (BUSPAR) 5 MG tablet Take 1 tablet (5 mg total) by mouth 2 (two) times daily. 180 tablet 4   FLUoxetine (PROZAC) 40 MG capsule Take 1 capsule (40 mg total) by mouth daily. 90 capsule 5   hydrOXYzine (ATARAX/VISTARIL) 10 MG tablet Take 1 tablet (10 mg total) by mouth 3 (three) times daily as needed. 30 tablet 0   levonorgestrel-ethinyl estradiol (LILLOW) 0.15-30 MG-MCG tablet Take 1 tablet by mouth daily. 28 tablet 11   nitrofurantoin, macrocrystal-monohydrate, (MACROBID) 100 MG capsule Take 100 MG (one tablet) by mouth after intercourse for prevention. 40 capsule 1   No facility-administered medications prior to visit.    No Known Allergies  Review of Systems  Constitutional: Negative.   Respiratory: Negative.    Cardiovascular: Negative.  Gastrointestinal: Negative.   Genitourinary: Negative.   Musculoskeletal:  Positive for arthralgias (right knee).  Neurological: Negative.       Objective:    Physical Exam Vitals and nursing note reviewed.  Constitutional:      General: She is not in acute distress.    Appearance: Normal appearance.  HENT:     Head: Normocephalic.  Eyes:     Conjunctiva/sclera: Conjunctivae normal.  Cardiovascular:     Rate and Rhythm: Normal rate.  Pulmonary:     Effort: Pulmonary effort is normal.  Musculoskeletal:        General: Normal range of motion.     Cervical back: Normal range of motion.     Comments: Pain with ROM. Tenderness around right  patella. Negative anterior and posterior drawer test  Skin:    General: Skin is warm.  Neurological:     General: No focal deficit present.     Mental Status: She is alert and oriented to person, place, and time.  Psychiatric:        Mood and Affect: Mood normal.        Behavior: Behavior normal.        Thought Content: Thought content normal.        Judgment: Judgment normal.    BP 101/68   Pulse (!) 102   Temp 98.4 F (36.9 C) (Oral)   Wt 121 lb 6.4 oz (55.1 kg)   SpO2 98%   BMI 21.51 kg/m  Wt Readings from Last 3 Encounters:  08/13/21 121 lb 6.4 oz (55.1 kg)  07/31/21 121 lb 3.2 oz (55 kg)  06/19/21 118 lb (53.5 kg)    There are no preventive care reminders to display for this patient.   There are no preventive care reminders to display for this patient.   Lab Results  Component Value Date   TSH 0.981 04/08/2021   Lab Results  Component Value Date   WBC 7.5 04/08/2021   HGB 14.7 04/08/2021   HCT 43.7 04/08/2021   MCV 89 04/08/2021   PLT 271 04/08/2021   Lab Results  Component Value Date   NA 139 04/08/2021   K 4.0 04/08/2021   CO2 24 04/08/2021   GLUCOSE 87 04/08/2021   BUN 8 04/08/2021   CREATININE 0.70 04/08/2021   BILITOT 0.6 04/08/2021   ALKPHOS 50 04/08/2021   AST 20 04/08/2021   ALT 13 04/08/2021   PROT 7.1 04/08/2021   ALBUMIN 4.7 04/08/2021   CALCIUM 9.6 04/08/2021   ANIONGAP 11 09/06/2019   EGFR 128 04/08/2021   Lab Results  Component Value Date   CHOL 150 08/28/2019   Lab Results  Component Value Date   HDL 48 08/28/2019   Lab Results  Component Value Date   LDLCALC 74 08/28/2019   Lab Results  Component Value Date   TRIG 163 (H) 08/28/2019   No results found for: CHOLHDL No results found for: HGBA1C     Assessment & Plan:   Problem List Items Addressed This Visit   None Visit Diagnoses     Acute pain of right knee    -  Primary   Ongoing pain. Will check x-ray of right knee. Continue Tylenol and Voltaren gel. Can  use ice and a brace during the day. Will schedule for knee injection   Relevant Orders   DG Knee Complete 4 Views Right   Need for influenza vaccination       Flu vaccine given today  Relevant Orders   Flu Vaccine QUAD 40moIM (Fluarix, Fluzone & Alfiuria Quad PF) (Completed)   Need for Tdap vaccination       Tdap given today   Relevant Orders   Tdap vaccine greater than or equal to 7yo IM (Completed)        No orders of the defined types were placed in this encounter.    LCharyl Dancer NP

## 2021-08-13 NOTE — Patient Instructions (Addendum)
[facility] for knee x-rays You can take tylenol as needed for pain along with voltaren gel You can wear the brace during the day but take it off at night Can use ice and or heat for 15 minutes at a time 4 times a day

## 2021-08-14 DIAGNOSIS — F330 Major depressive disorder, recurrent, mild: Secondary | ICD-10-CM | POA: Diagnosis not present

## 2021-08-14 DIAGNOSIS — M25561 Pain in right knee: Secondary | ICD-10-CM | POA: Diagnosis not present

## 2021-08-14 DIAGNOSIS — F411 Generalized anxiety disorder: Secondary | ICD-10-CM | POA: Diagnosis not present

## 2021-08-17 ENCOUNTER — Encounter: Payer: Federal, State, Local not specified - PPO | Admitting: Certified Nurse Midwife

## 2021-08-19 ENCOUNTER — Encounter: Payer: Self-pay | Admitting: Certified Nurse Midwife

## 2021-08-24 DIAGNOSIS — M222X1 Patellofemoral disorders, right knee: Secondary | ICD-10-CM | POA: Diagnosis not present

## 2021-09-17 ENCOUNTER — Ambulatory Visit: Payer: Federal, State, Local not specified - PPO | Admitting: Gastroenterology

## 2021-09-17 ENCOUNTER — Encounter: Payer: Self-pay | Admitting: Gastroenterology

## 2021-09-17 ENCOUNTER — Ambulatory Visit: Payer: Federal, State, Local not specified - PPO | Admitting: Anesthesiology

## 2021-09-17 ENCOUNTER — Encounter
Admission: RE | Disposition: A | Discharge status: Home / Self Care | Payer: Self-pay | Source: Home / Self Care | Attending: Gastroenterology

## 2021-09-17 ENCOUNTER — Ambulatory Visit
Admission: RE | Admit: 2021-09-17 | Discharge: 2021-09-17 | Disposition: A | Discharge status: Home / Self Care | Payer: Federal, State, Local not specified - PPO | Attending: Gastroenterology | Admitting: Gastroenterology

## 2021-09-17 DIAGNOSIS — R109 Unspecified abdominal pain: Secondary | ICD-10-CM | POA: Insufficient documentation

## 2021-09-17 DIAGNOSIS — R194 Change in bowel habit: Secondary | ICD-10-CM | POA: Insufficient documentation

## 2021-09-17 DIAGNOSIS — R195 Other fecal abnormalities: Secondary | ICD-10-CM | POA: Diagnosis not present

## 2021-09-17 DIAGNOSIS — K635 Polyp of colon: Secondary | ICD-10-CM | POA: Insufficient documentation

## 2021-09-17 DIAGNOSIS — F419 Anxiety disorder, unspecified: Secondary | ICD-10-CM | POA: Insufficient documentation

## 2021-09-17 DIAGNOSIS — K529 Noninfective gastroenteritis and colitis, unspecified: Secondary | ICD-10-CM

## 2021-09-17 HISTORY — PX: COLONOSCOPY WITH PROPOFOL: SHX5780

## 2021-09-17 SURGERY — COLONOSCOPY WITH PROPOFOL
Anesthesia: General

## 2021-09-17 MED ORDER — SODIUM CHLORIDE 0.9 % IV SOLN
INTRAVENOUS | Status: DC
  Administered 2021-09-17: 1000 mL via INTRAVENOUS

## 2021-09-17 MED ORDER — PROPOFOL 500 MG/50ML IV EMUL
INTRAVENOUS | Status: AC
  Filled 2021-09-17: qty 50

## 2021-09-17 MED ORDER — EPHEDRINE 5 MG/ML INJ
INTRAVENOUS | Status: AC
  Filled 2021-09-17: qty 5

## 2021-09-17 MED ORDER — PROPOFOL 500 MG/50ML IV EMUL
INTRAVENOUS | Status: DC | PRN
  Administered 2021-09-17: 150 ug/kg/min via INTRAVENOUS

## 2021-09-17 MED ORDER — FENTANYL CITRATE (PF) 100 MCG/2ML IJ SOLN
INTRAMUSCULAR | Status: AC
  Filled 2021-09-17: qty 2

## 2021-09-17 MED ORDER — FENTANYL CITRATE (PF) 100 MCG/2ML IJ SOLN
INTRAMUSCULAR | Status: DC | PRN
  Administered 2021-09-17: 50 ug via INTRAVENOUS

## 2021-09-17 NOTE — Transfer of Care (Signed)
Immediate Anesthesia Transfer of Care Note  Patient: Brenda Rangel  Procedure(s) Performed: COLONOSCOPY WITH PROPOFOL  Patient Location: PACU  Anesthesia Type:General  Level of Consciousness: awake and sedated  Airway & Oxygen Therapy: Patient Spontanous Breathing and Patient connected to nasal cannula oxygen  Post-op Assessment: Report given to RN and Post -op Vital signs reviewed and stable  Post vital signs: Reviewed and stable  Last Vitals:  Vitals Value Taken Time  BP    Temp    Pulse    Resp    SpO2      Last Pain:  Vitals:   09/17/21 0749  TempSrc: Temporal         Complications: No notable events documented.

## 2021-09-17 NOTE — H&P (Signed)
Vonda Antigua, MD [address] [place], [place], [place], [address] [id number], [place], [place], [place], [address] Phone: [phone]  Fax: [phone]  Primary Care Physician:  Venita Lick, NP   Pre-Procedure History & Physical: HPI:  Brenda Rangel is a 20 y.o. female is here for a colonoscopy.   Past Medical History:  Diagnosis Date   Abdominal migraine    Anxiety     Past Surgical History:  Procedure Laterality Date   NO PAST SURGERIES      Prior to Admission medications   Medication Sig Start Date End Date Taking? Authorizing Provider  busPIRone (BUSPAR) 5 MG tablet Take 1 tablet (5 mg total) by mouth 2 (two) times daily. 05/08/21  Yes Cannady, Jolene T, NP  FLUoxetine (PROZAC) 40 MG capsule Take 1 capsule (40 mg total) by mouth daily. 02/29/20  Yes Cannady, Jolene T, NP  hydrOXYzine (ATARAX/VISTARIL) 10 MG tablet Take 1 tablet (10 mg total) by mouth 3 (three) times daily as needed. 11/16/19  Yes Cannady, Henrine Screws T, NP  levonorgestrel-ethinyl estradiol (LILLOW) 0.15-30 MG-MCG tablet Take 1 tablet by mouth daily. 04/08/21  Yes Cannady, Jolene T, NP  nitrofurantoin, macrocrystal-monohydrate, (MACROBID) 100 MG capsule Take 100 MG (one tablet) by mouth after intercourse for prevention. Patient not taking: Reported on 09/17/2021 06/19/21   Marnee Guarneri T, NP    Allergies as of 07/30/2021   (No Known Allergies)    Family History  Problem Relation Age of Onset   Anxiety disorder Mother    Depression Mother    Anxiety disorder Father    Depression Father    Pectus excavatum Sister    Breast cancer Maternal Grandmother    Pectus excavatum Adoptive Father     Social History   Socioeconomic History   Marital status: Single    Spouse name: Not on file   Number of children: Not on file   Years of education: Not on file   Highest education level: Not on file  Occupational History    Comment: [employer] --- associates in science  Tobacco Use   Smoking  status: Never   Smokeless tobacco: Never  Vaping Use   Vaping Use: Never used  Substance and Sexual Activity   Alcohol use: Yes    Comment: soccially   Drug use: Never   Sexual activity: Yes    Birth control/protection: Pill    Comment: monogamous  Other Topics Concern   Not on file  Social History Narrative   Not on file   Social Determinants of Health   Financial Resource Strain: Not on file  Food Insecurity: Not on file  Transportation Needs: Not on file  Physical Activity: Not on file  Stress: Not on file  Social Connections: Not on file  Intimate Partner Violence: Not on file    Review of Systems: See HPI, otherwise negative ROS  Physical Exam: Constitutional: General:   Alert,  Well-developed, well-nourished, pleasant and cooperative in NAD BP 123/86   Pulse (!) 102   Temp (!) 97.2 F (36.2 C) (Temporal)   Resp 16   LMP 09/15/2021 Comment: Pregnancy test negative  SpO2 100%   Head: Normocephalic, atraumatic.   Eyes:  Sclera clear, no icterus.   Conjunctiva pink.   Mouth:  No deformity or lesions, oropharynx pink & moist.  Neck:  Supple, trachea midline  Respiratory: Normal respiratory effort  Gastrointestinal:  Soft, non-tender and non-distended without masses, hepatosplenomegaly or hernias noted.  No guarding or rebound tenderness.  Cardiac: No clubbing or edema.  No cyanosis. Normal posterior tibial pedal pulses noted.  Lymphatic:  No significant cervical adenopathy.  Psych:  Alert and cooperative. Normal mood and affect.  Musculoskeletal:   Symmetrical without gross deformities. 5/5 Lower extremity strength bilaterally.  Skin: Warm. Intact without significant lesions or rashes. No jaundice.  Neurologic:  Face symmetrical, tongue midline, Normal sensation to touch;  grossly normal neurologically.  Psych:  Alert and oriented x3, Alert and cooperative. Normal mood and affect.  Impression/Plan: Brenda Rangel is here for a colonoscopy to  be performed for abdominal pain, elevated fecal calprotectin  Risks, benefits, limitations, and alternatives regarding  colonoscopy have been reviewed with the patient.  Questions have been answered.  All parties agreeable.   Virgel Manifold, MD  09/17/2021, 8:39 AM

## 2021-09-17 NOTE — Anesthesia Postprocedure Evaluation (Signed)
Anesthesia Post Note  Patient: Brenda Rangel  Procedure(s) Performed: COLONOSCOPY WITH PROPOFOL  Patient location during evaluation: PACU Anesthesia Type: General Level of consciousness: awake and alert, oriented and patient cooperative Pain management: pain level controlled Vital Signs Assessment: post-procedure vital signs reviewed and stable Respiratory status: spontaneous breathing, nonlabored ventilation and respiratory function stable Cardiovascular status: blood pressure returned to baseline and stable Postop Assessment: adequate PO intake Anesthetic complications: no   No notable events documented.   Last Vitals:  Vitals:   09/17/21 0909 09/17/21 0922  BP: (!) 90/51 105/66  Pulse: 80   Resp: 16   Temp: (!) 36.1 C   SpO2: 98%     Last Pain:  Vitals:   09/17/21 0909  TempSrc: Temporal                 Darrin Nipper

## 2021-09-17 NOTE — Op Note (Signed)
[facility]astroenter[facility] Patient Name: Brenda Rangel Procedure Date: 09/17/2021 8:27 AM MRN: [id number] Account #: [id number] Date of Birth: 05-05-2001 Admit Type: Outpatient Age: 20 Room: [facility] ENDO ROOM 3 Gender: Female Note Status: Finalized Instrument Name: Park Meo [id number] Procedure:             Colonoscopy Indications:           Abdominal pain, Incidental change in bowel habits                         noted, Elevated fecal calprotectin in the borderline                         range per lab reference range twice 3 months apart. Providers:             Lennette Bihari. Bonna Gains MD, MD Referring MD:          Barbaraann Faster. Ned Card (Referring MD) Medicines:             Monitored Anesthesia Care Complications:         No immediate complications. Procedure:             Pre-Anesthesia Assessment:                        - ASA Grade Assessment: II - A patient with mild                         systemic disease.                        - Prior to the procedure, a History and Physical was                         performed, and patient medications, allergies and                         sensitivities were reviewed. The patient's tolerance                         of previous anesthesia was reviewed.                        - The risks and benefits of the procedure and the                         sedation options and risks were discussed with the                         patient. All questions were answered and informed                         consent was obtained.                        - Patient identification and proposed procedure were                         verified prior to the procedure by the physician, the  nurse, the anesthesiologist, the anesthetist and the                         technician. The procedure was verified in the                         procedure room.                        After obtaining informed consent, the colonoscope was                          passed under direct vision. Throughout the procedure,                         the patient's blood pressure, pulse, and oxygen                         saturations were monitored continuously. The                         Colonoscope was introduced through the anus and                         advanced to the the terminal ileum. The colonoscopy                         was performed with ease. The patient tolerated the                         procedure well. The quality of the bowel preparation                         was good. Findings:      The perianal and digital rectal examinations were normal.      A 4 mm polyp was found in the sigmoid colon. The polyp was sessile. The       polyp was removed with a cold biopsy forceps. Resection and retrieval       were complete.      The exam was otherwise without abnormality.      The rectum, sigmoid colon, descending colon, transverse colon, ascending       colon, cecum and ileum appeared normal. Biopsies for histology were       taken with a cold forceps from the cecum, ascending colon, transverse       colon, descending colon and sigmoid colon for evaluation of microscopic       colitis.      The retroflexed view of the distal rectum and anal verge was normal and       showed no anal or rectal abnormalities. Impression:            - One 4 mm polyp in the sigmoid colon, removed with a                         cold biopsy forceps. Resected and retrieved.                        - The examination was otherwise normal.                        -  The rectum, sigmoid colon, descending colon,                         transverse colon, ascending colon, cecum and terminal                         ileum are normal. Biopsied.                        - The distal rectum and anal verge are normal on                         retroflexion view. Recommendation:        - Await pathology results.                        - Discharge patient to home  (with escort).                        - Advance diet as tolerated.                        - Continue present medications.                        - The findings and recommendations were discussed with                         the patient.                        - The findings and recommendations were discussed with                         the patient's family.                        - Return to primary care physician as previously                         scheduled. Procedure Code(s):     --- Professional ---                        [phone], Colonoscopy, flexible; with biopsy, single or                         multiple Diagnosis Code(s):     --- Professional ---                        K63.5, Polyp of colon                        R10.9, Unspecified abdominal pain CPT copyright 2019 American Medical Association. All rights reserved. The codes documented in this report are preliminary and upon coder review may  be revised to meet current compliance requirements.  Vonda Antigua, MD Margretta Sidle B. Bonna Gains MD, MD 09/17/2021 9:12:46 AM This report has been signed electronically. Number of Addenda: 0 Note Initiated On: 09/17/2021 8:27 AM Scope Withdrawal Time: 0 hours 14 minutes 6 seconds  Total Procedure Duration: 0 hours 18 minutes 1 second  Estimated Blood  Loss:  Estimated blood loss: none.      [facility]

## 2021-09-17 NOTE — Anesthesia Procedure Notes (Signed)
Date/Time: 09/17/2021 8:38 AM Performed by: Vaughan Sine Pre-anesthesia Checklist: Patient identified, Emergency Drugs available, Suction available, Patient being monitored and Timeout performed Patient Re-evaluated:Patient Re-evaluated prior to induction Oxygen Delivery Method: Nasal cannula Preoxygenation: Pre-oxygenation with 100% oxygen Induction Type: IV induction Placement Confirmation: positive ETCO2 and CO2 detector

## 2021-09-17 NOTE — Anesthesia Preprocedure Evaluation (Signed)
Anesthesia Evaluation  Patient identified by MRN, date of birth, ID band Patient awake    Reviewed: Allergy & Precautions, NPO status , Patient's Chart, lab work & pertinent test results  History of Anesthesia Complications Negative for: history of anesthetic complications  Airway Mallampati: III   Neck ROM: Full    Dental no notable dental hx.    Pulmonary neg pulmonary ROS,    Pulmonary exam normal breath sounds clear to auscultation       Cardiovascular Exercise Tolerance: Good negative cardio ROS Normal cardiovascular exam Rhythm:Regular Rate:Normal     Neuro/Psych  Headaches, PSYCHIATRIC DISORDERS Anxiety    GI/Hepatic negative GI ROS,   Endo/Other  negative endocrine ROS  Renal/GU negative Renal ROS     Musculoskeletal   Abdominal   Peds  Hematology negative hematology ROS (+)   Anesthesia Other Findings   Reproductive/Obstetrics                             Anesthesia Physical Anesthesia Plan  ASA: 2  Anesthesia Plan: General   Post-op Pain Management:    Induction: Intravenous  PONV Risk Score and Plan: 3 and Propofol infusion, TIVA and Treatment may vary due to age or medical condition  Airway Management Planned: Natural Airway  Additional Equipment:   Intra-op Plan:   Post-operative Plan:   Informed Consent: I have reviewed the patients History and Physical, chart, labs and discussed the procedure including the risks, benefits and alternatives for the proposed anesthesia with the patient or authorized representative who has indicated his/her understanding and acceptance.       Plan Discussed with: CRNA  Anesthesia Plan Comments: (LMA/GETA backup discussed.  Patient consented for risks of anesthesia including but not limited to:  - adverse reactions to medications - damage to eyes, teeth, lips or other oral mucosa - nerve damage due to positioning  - sore  throat or hoarseness - damage to heart, brain, nerves, lungs, other parts of body or loss of life  Informed patient about role of CRNA in peri- and intra-operative care.  Patient voiced understanding.)        Anesthesia Quick Evaluation

## 2021-09-18 ENCOUNTER — Encounter: Payer: Self-pay | Admitting: Gastroenterology

## 2021-09-18 LAB — SURGICAL PATHOLOGY

## 2021-09-21 ENCOUNTER — Encounter: Payer: Federal, State, Local not specified - PPO | Admitting: Certified Nurse Midwife

## 2021-09-23 ENCOUNTER — Telehealth: Payer: Self-pay | Admitting: Gastroenterology

## 2021-09-23 NOTE — Telephone Encounter (Signed)
Inbound call from pt requesting a call back to go over her colonoscopy results

## 2021-09-28 NOTE — Telephone Encounter (Signed)
Pt stated that although procedure and labs are not really showing the etiology of symptoms, she waned to know if there may be any additional labs etc that could be ordered to investigate further.... Pt already has appt in January with Dr Marius Ditch.... Please advise

## 2021-10-02 ENCOUNTER — Ambulatory Visit: Payer: Federal, State, Local not specified - PPO | Admitting: Nurse Practitioner

## 2021-11-11 ENCOUNTER — Ambulatory Visit: Payer: Federal, State, Local not specified - PPO | Admitting: Gastroenterology

## 2021-11-13 DIAGNOSIS — F411 Generalized anxiety disorder: Secondary | ICD-10-CM | POA: Diagnosis not present

## 2021-11-13 DIAGNOSIS — F330 Major depressive disorder, recurrent, mild: Secondary | ICD-10-CM | POA: Diagnosis not present

## 2021-11-16 ENCOUNTER — Encounter: Payer: Federal, State, Local not specified - PPO | Admitting: Certified Nurse Midwife

## 2021-12-01 DIAGNOSIS — M25572 Pain in left ankle and joints of left foot: Secondary | ICD-10-CM | POA: Diagnosis not present

## 2021-12-01 DIAGNOSIS — S96912A Strain of unspecified muscle and tendon at ankle and foot level, left foot, initial encounter: Secondary | ICD-10-CM | POA: Diagnosis not present

## 2021-12-07 ENCOUNTER — Other Ambulatory Visit: Payer: Self-pay

## 2021-12-07 ENCOUNTER — Ambulatory Visit
Admission: EM | Admit: 2021-12-07 | Discharge: 2021-12-07 | Disposition: A | Discharge status: Home / Self Care | Payer: Federal, State, Local not specified - PPO | Attending: Internal Medicine | Admitting: Internal Medicine

## 2021-12-07 DIAGNOSIS — Z20822 Contact with and (suspected) exposure to covid-19: Secondary | ICD-10-CM | POA: Diagnosis not present

## 2021-12-07 DIAGNOSIS — J069 Acute upper respiratory infection, unspecified: Secondary | ICD-10-CM | POA: Diagnosis not present

## 2021-12-07 MED ORDER — BENZONATATE 100 MG PO CAPS: 100.0000 mg | ORAL_CAPSULE | Freq: Three times a day (TID) | ORAL | 0 refills | Status: DC | PRN

## 2021-12-07 NOTE — ED Triage Notes (Signed)
Patient here for "Cough, Congestion'. Started with a "cold last week". @ home test for COVID19 "Negative". A lot of mucous in beginning "improving". Lingering "cough" remains "x5 days or so". No fever.

## 2021-12-07 NOTE — Discharge Instructions (Addendum)
Please use a humidifier at bedtime Vapor rub will help with nasal congestion Saline nasal spray recommended Take medications as prescribed We will call you with recommendations if labs are abnormal.

## 2021-12-08 DIAGNOSIS — F411 Generalized anxiety disorder: Secondary | ICD-10-CM | POA: Diagnosis not present

## 2021-12-08 DIAGNOSIS — F330 Major depressive disorder, recurrent, mild: Secondary | ICD-10-CM | POA: Diagnosis not present

## 2021-12-08 LAB — SARS CORONAVIRUS 2 (TAT 6-24 HRS): SARS Coronavirus 2: NEGATIVE

## 2021-12-08 NOTE — ED Provider Notes (Signed)
[facility]    CSN: [id number] Arrival date & time: 12/07/21  0840      History   Chief Complaint Chief Complaint  Patient presents with   Cough   Nasal Congestion    HPI Ann Bohne is a 21 y.o. female comes to urgent care with 5-day history of nasal congestion and nonproductive cough.  Patient symptoms started with runny nose, nasal congestion last week.  Patient did home COVID tests which were negative.  Following that the patient started having significant clear sputum production over the past couple of days.  No fever or chills.  No sore throat.  No vomiting or diarrhea.  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      Home Medications    Prior to Admission medications   Medication Sig Start Date End Date Taking? Authorizing Provider  benzonatate (TESSALON) 100 MG capsule Take 1 capsule (100 mg total) by mouth 3 (three) times daily as needed for cough. 12/07/21  Yes Athina Fahey, Myrene Galas, MD  busPIRone (BUSPAR) 5 MG tablet Take 1 tablet (5 mg total) by mouth 2 (two) times daily. 05/08/21  Yes Cannady, Henrine Screws T, NP  levonorgestrel-ethinyl estradiol (LILLOW) 0.15-30 MG-MCG tablet Take 1 tablet by mouth daily. 04/08/21  Yes Cannady, Jolene T, NP  hydrOXYzine (ATARAX/VISTARIL) 10 MG tablet Take  1 tablet (10 mg total) by mouth 3 (three) times daily as needed. 11/16/19   Venita Lick, NP    Family History Family History  Problem Relation Age of Onset   Anxiety disorder Mother    Depression Mother    Anxiety disorder Father    Depression Father    Pectus excavatum Sister    Breast cancer Maternal Grandmother    Pectus excavatum Adoptive Father     Social History Social History   Tobacco Use   Smoking status: Never   Smokeless tobacco: Never  Vaping Use   Vaping Use: Never used  Substance Use Topics   Alcohol use: Yes    Comment: soccially   Drug use: Never     Allergies   Patient has no known allergies.   Review of Systems Review of Systems  Constitutional: Negative.   HENT:  Positive for congestion and postnasal drip. Negative for sore throat.   Respiratory:  Positive for cough.   Neurological: Negative.     Physical Exam Triage Vital Signs ED Triage Vitals  Enc Vitals Group     BP 12/07/21 0927 113/71     Pulse Rate 12/07/21 0927 85     Resp 12/07/21 0927 18     Temp 12/07/21 0927 98.5 F (36.9 C)     Temp Source 12/07/21 0927 Oral     SpO2 12/07/21 0927 100 %  Weight 12/07/21 0924 121 lb 7.6 oz (55.1 kg)     Height 12/07/21 0924 5\' 3"  (1.6 m)     Head Circumference --      Peak Flow --      Pain Score 12/07/21 0924 0     Pain Loc --      Pain Edu? --      Excl. in [place]? --    No data found.  Updated Vital Signs BP 113/71 (BP Location: Left Arm)    Pulse 85    Temp 98.5 F (36.9 C) (Oral)    Resp 18    Ht 5\' 3"  (1.6 m)    Wt 55.1 kg    LMP 11/06/2021 (Approximate)    SpO2 100%    BMI 21.52 kg/m   Visual Acuity Right Eye Distance:   Left Eye Distance:   Bilateral Distance:    Right Eye Near:   Left Eye Near:    Bilateral Near:     Physical Exam Vitals and nursing note reviewed.  Constitutional:      General: She is not in acute distress.    Appearance: Normal appearance. She is not ill-appearing.  HENT:     Right Ear:  Tympanic membrane normal.     Left Ear: Tympanic membrane normal.     Mouth/Throat:     Mouth: Mucous membranes are moist.     Pharynx: Posterior oropharyngeal erythema present.  Cardiovascular:     Rate and Rhythm: Normal rate and regular rhythm.  Pulmonary:     Effort: Pulmonary effort is normal.     Breath sounds: Normal breath sounds.  Abdominal:     General: Bowel sounds are normal.     Palpations: Abdomen is soft.  Neurological:     Mental Status: She is alert.     UC Treatments / Results  Labs (all labs ordered are listed, but only abnormal results are displayed) Labs Reviewed  SARS CORONAVIRUS 2 (TAT 6-24 HRS)    EKG   Radiology No results found.  Procedures Procedures (including critical care time)  Medications Ordered in UC Medications - No data to display  Initial Impression / Assessment and Plan / UC Course  I have reviewed the triage vital signs and the nursing notes.  Pertinent labs & imaging results that were available during my care of the patient were reviewed by me and considered in my medical decision making (see chart for details).     1.  Viral URI with cough: Tessalon Perles as needed for cough Humidifier use and vapor rub use recommended Saline nasal spray. Maintain adequate hydration Return precautions. Final Clinical Impressions(s) / UC Diagnoses   Final diagnoses:  Viral URI with cough     Discharge Instructions      Please use a humidifier at bedtime Vapor rub will help with nasal congestion Saline nasal spray recommended Take medications as prescribed We will call you with recommendations if labs are abnormal.   ED Prescriptions     Medication Sig Dispense Auth. Provider   benzonatate (TESSALON) 100 MG capsule Take 1 capsule (100 mg total) by mouth 3 (three) times daily as needed for cough. 21 capsule Deandrea Rion, Myrene Galas, MD      PDMP not reviewed this encounter.   Chase Picket, MD 12/08/21 [phone]

## 2021-12-13 NOTE — Patient Instructions (Signed)
Managing Anxiety, Adult ?After being diagnosed with anxiety, you may be relieved to know why you have felt or behaved a certain way. You may also feel overwhelmed about the treatment ahead and what it will mean for your life. With care and support, you can manage this condition. ?How to manage lifestyle changes ?Managing stress and anxiety ?Stress is your body's reaction to life changes and events, both good and bad. Most stress will last just a few hours, but stress can be ongoing and can lead to more than just stress. Although stress can play a major role in anxiety, it is not the same as anxiety. Stress is usually caused by something external, such as a deadline, test, or competition. Stress normally passes after the triggering event has ended.  ?Anxiety is caused by something internal, such as imagining a terrible outcome or worrying that something will go wrong that will devastate you. Anxiety often does not go away even after the triggering event is over, and it can become long-term (chronic) worry. It is important to understand the differences between stress and anxiety and to manage your stress effectively so that it does not lead to an anxious response. ?Talk with your health care provider or a counselor to learn more about reducing anxiety and stress. He or she may suggest tension reduction techniques, such as: ?Music therapy. Spend time creating or listening to music that you enjoy and that inspires you. ?Mindfulness-based meditation. Practice being aware of your normal breaths while not trying to control your breathing. It can be done while sitting or walking. ?Centering prayer. This involves focusing on a word, phrase, or sacred image that means something to you and brings you peace. ?Deep breathing. To do this, expand your stomach and inhale slowly through your nose. Hold your breath for 3-5 seconds. Then exhale slowly, letting your stomach muscles relax. ?Self-talk. Learn to notice and identify  thought patterns that lead to anxiety reactions and change those patterns to thoughts that feel peaceful. ?Muscle relaxation. Taking time to tense muscles and then relax them. ?Choose a tension reduction technique that fits your lifestyle and personality. These techniques take time and practice. Set aside 5-15 minutes a day to do them. Therapists can offer counseling and training in these techniques. The training to help with anxiety may be covered by some insurance plans. ?Other things you can do to manage stress and anxiety include: ?Keeping a stress diary. This can help you learn what triggers your reaction and then learn ways to manage your response. ?Thinking about how you react to certain situations. You may not be able to control everything, but you can control your response. ?Making time for activities that help you relax and not feeling guilty about spending your time in this way. ?Doing visual imagery. This involves imagining or creating mental pictures to help you relax. ?Practicing yoga. Through yoga poses, you can lower tension and promote relaxation. ? ?Medicines ?Medicines can help ease symptoms. Medicines for anxiety include: ?Antidepressant medicines. These are usually prescribed for long-term daily control. ?Anti-anxiety medicines. These may be added in severe cases, especially when panic attacks occur. ?Medicines will be prescribed by a health care provider. When used together, medicines, psychotherapy, and tension reduction techniques may be the most effective treatment. ?Relationships ?Relationships can play a big part in helping you recover. Try to spend more time connecting with trusted friends and family members. ?Consider going to couples counseling if you have a partner, taking family education classes, or going to family   therapy. ?Therapy can help you and others better understand your condition. ?How to recognize changes in your anxiety ?Everyone responds differently to treatment for  anxiety. Recovery from anxiety happens when symptoms decrease and stop interfering with your daily activities at home or work. This may mean that you will start to: ?Have better concentration and focus. Worry will interfere less in your daily thinking. ?Sleep better. ?Be less irritable. ?Have more energy. ?Have improved memory. ?It is also important to recognize when your condition is getting worse. Contact your health care provider if your symptoms interfere with home or work and you feel like your condition is not improving. ?Follow these instructions at home: ?Activity ?Exercise. Adults should do the following: ?Exercise for at least 150 minutes each week. The exercise should increase your heart rate and make you sweat (moderate-intensity exercise). ?Strengthening exercises at least twice a week. ?Get the right amount and quality of sleep. Most adults need 7-9 hours of sleep each night. ?Lifestyle ? ?Eat a healthy diet that includes plenty of vegetables, fruits, whole grains, low-fat dairy products, and lean protein. ?Do not eat a lot of foods that are high in fats, added sugars, or salt (sodium). ?Make choices that simplify your life. ?Do not use any products that contain nicotine or tobacco. These products include cigarettes, chewing tobacco, and vaping devices, such as e-cigarettes. If you need help quitting, ask your health care provider. ?Avoid caffeine, alcohol, and certain over-the-counter cold medicines. These may make you feel worse. Ask your pharmacist which medicines to avoid. ?General instructions ?Take over-the-counter and prescription medicines only as told by your health care provider. ?Keep all follow-up visits. This is important. ?Where to find support ?You can get help and support from these sources: ?Self-help groups. ?Online and community organizations. ?A trusted spiritual leader. ?Couples counseling. ?Family education classes. ?Family therapy. ?Where to find more information ?You may find  that joining a support group helps you deal with your anxiety. The following sources can help you locate counselors or support groups near you: ?Mental Health America: www.mentalhealthamerica.net ?Anxiety and Depression Association of America (ADAA): www.adaa.org ?National Alliance on Mental Illness (NAMI): www.nami.org ?Contact a health care provider if: ?You have a hard time staying focused or finishing daily tasks. ?You spend many hours a day feeling worried about everyday life. ?You become exhausted by worry. ?You start to have headaches or frequently feel tense. ?You develop chronic nausea or diarrhea. ?Get help right away if: ?You have a racing heart and shortness of breath. ?You have thoughts of hurting yourself or others. ?If you ever feel like you may hurt yourself or others, or have thoughts about taking your own life, get help right away. Go to your nearest emergency department or: ?Call your local emergency services (911 in the [place] ?Call a suicide crisis helpline, such as the National Suicide Prevention Lifeline at 1-800-273-8255 or 988 in the [place] This is open 24 hours a day in the [place] ?Text the Crisis Text Line at 741741 (in the [place] ?Summary ?Taking steps to learn and use tension reduction techniques can help calm you and help prevent triggering an anxiety reaction. ?When used together, medicines, psychotherapy, and tension reduction techniques may be the most effective treatment. ?Family, friends, and partners can play a big part in supporting you. ?This information is not intended to replace advice given to you by your health care provider. Make sure you discuss any questions you have with your health care provider. ?Document Revised: 04/29/2021 Document Reviewed: 01/25/2021 ?Elsevier Patient   Education ? 2022 Elsevier Inc. ? ?

## 2021-12-18 ENCOUNTER — Encounter: Payer: Self-pay | Admitting: Nurse Practitioner

## 2021-12-18 ENCOUNTER — Ambulatory Visit: Payer: Federal, State, Local not specified - PPO | Admitting: Nurse Practitioner

## 2021-12-18 ENCOUNTER — Other Ambulatory Visit: Payer: Self-pay

## 2021-12-18 DIAGNOSIS — F411 Generalized anxiety disorder: Secondary | ICD-10-CM | POA: Diagnosis not present

## 2021-12-18 MED ORDER — VENLAFAXINE HCL ER 37.5 MG PO CP24: 37.5000 mg | ORAL_CAPSULE | Freq: Every day | ORAL | 4 refills | Status: DC

## 2021-12-18 NOTE — Assessment & Plan Note (Signed)
Ongoing, stable.  Denies SI/HI.  Start Effexor 37.5  MG daily and continue Buspar 5 MG BID adjust as needed, educated her on this medication and possible side effects.  Continue Vistaril as needed for SEVERE anxiety episodes.  Educated on medications.  Recommend meditation and relaxation exercises at home + return to therapy sessions.  Therapy should offer benefit to overall mood.  Return in 6 weeks. ?

## 2021-12-18 NOTE — Progress Notes (Signed)
? ?BP 103/68   Pulse 99   Temp 98.2 ?F (36.8 ?C) (Oral)   Ht 5\' 3"  (1.6 m)   Wt 124 lb (56.2 kg)   SpO2 98%   BMI 21.97 kg/m?   ? ?Subjective:  ? ? Patient ID: Brenda Rangel, female    DOB: 23-Nov-2000, 21 y.o.   MRN: [id number] ? ?HPI: ?Brenda Rangel is a 21 y.o. female ? ?Chief Complaint  ?Patient presents with  ? Mood   ?  Patient states she feels like she is overstimulated. Patient states she is starting to feel very drained and exhausted everyday.   ? ?ANXIETY/STRESS ?Taking Buspar 5 MG BID.  Is in school at [employer] and has a lot of responsibilities that are overwhelming for her.  Has tried Zoloft and Prozac in past with no benefit. She attending counseling, [facility], last saw a couple Tuesdays ago.   ?Duration: ongoing ?Anxious mood: yes ?Excessive worrying: yes ?Irritability: yes ?Sweating: no ?Nausea: no ?Palpitations: none ?Hyperventilation: no ?Panic attacks: not recently ?Agoraphobia: no  ?Obscessions/compulsions: occasional ?Depressed mood: no ?Depression screen [facility] 2/9 12/18/2021 08/13/2021 07/31/2021 06/19/2021 05/08/2021  ?Decreased Interest 3 2 3  0 0  ?Down, Depressed, Hopeless 2 1 2  0 0  ?PHQ - 2 Score 5 3 5  0 0  ?Altered sleeping 1 3 2 1 1   ?Tired, decreased energy 3 3 2 1 1   ?Change in appetite 3 2 2  0 0  ?Feeling bad or failure about yourself  1 1 2  0 0  ?Trouble concentrating 2 2 3  0 1  ?Moving slowly or fidgety/restless 2 2 2  0 0  ?Suicidal thoughts 0 0 0 0 0  ?PHQ-9 Score 17 16 18 2 3   ?Difficult doing work/chores Somewhat difficult Very difficult - Not difficult at all Not difficult at all  ?Some recent data might be hidden  ?Anhedonia: no ?Weight changes: no ?Insomnia: yes hard to stay asleep  ?Hypersomnia: no ?Fatigue/loss of energy: no ?Feelings of worthlessness: no ?Feelings of guilt: no ?Impaired concentration/indecisiveness: yes ?Suicidal ideations: no  ?Crying spells: no ?Recent Stressors/Life Changes: yes ?  Relationship problems: no ?  Family stress: yes   ?  Financial  stress: no  ?  Job stress: yes ?  Recent death/loss: no ?GAD 7 : Generalized Anxiety Score 12/18/2021 08/13/2021 07/31/2021 06/19/2021  ?Nervous, Anxious, on Edge 1 3 3 2   ?Control/stop worrying 1 3 3 2   ?Worry too much - different things 3 3 3 2   ?Trouble relaxing 3 2 3 1   ?Restless 3 2 2 2   ?Easily annoyed or irritable 3 3 3 2   ?Afraid - awful might happen 0 1 1 2   ?Total GAD 7 Score 14 17 18 13   ?Anxiety Difficulty Somewhat difficult Very difficult Very difficult Somewhat difficult  ? ? ?Relevant past medical, surgical, family and social history reviewed and updated as indicated. Interim medical history since our last visit reviewed. ?Allergies and medications reviewed and updated. ? ?Review of Systems  ?Constitutional:  Negative for activity change, appetite change, diaphoresis, fatigue and fever.  ?Respiratory:  Negative for cough, chest tightness and shortness of breath.   ?Cardiovascular:  Negative for chest pain, palpitations and leg swelling.  ?Psychiatric/Behavioral:  Negative for decreased concentration, self-injury, sleep disturbance and suicidal ideas. The patient is nervous/anxious.   ? ?Per HPI unless specifically indicated above ? ?   ?Objective:  ?  ?BP 103/68   Pulse 99   Temp 98.2 ?F (36.8 ?C) (Oral)   Ht 5'  3" (1.6 m)   Wt 124 lb (56.2 kg)   SpO2 98%   BMI 21.97 kg/m?   ?Wt Readings from Last 3 Encounters:  ?12/18/21 124 lb (56.2 kg)  ?12/07/21 121 lb 7.6 oz (55.1 kg)  ?08/13/21 121 lb 6.4 oz (55.1 kg)  ?  ?Physical Exam ?Vitals and nursing note reviewed.  ?Constitutional:   ?   General: She is awake. She is not in acute distress. ?   Appearance: She is well-developed and well-groomed. She is not ill-appearing.  ?HENT:  ?   Head: Normocephalic.  ?   Right Ear: Hearing normal.  ?   Left Ear: Hearing normal.  ?Eyes:  ?   General: Lids are normal.     ?   Right eye: No discharge.     ?   Left eye: No discharge.  ?   Conjunctiva/sclera: Conjunctivae normal.  ?   Pupils: Pupils are equal, round, and  reactive to light.  ?Neck:  ?   Thyroid: No thyromegaly.  ?   Vascular: No carotid bruit.  ?Cardiovascular:  ?   Rate and Rhythm: Normal rate and regular rhythm.  ?   Heart sounds: Normal heart sounds. No murmur heard. ?  No gallop.  ?Pulmonary:  ?   Effort: Pulmonary effort is normal. No accessory muscle usage or respiratory distress.  ?   Breath sounds: Normal breath sounds.  ?Abdominal:  ?   General: Bowel sounds are normal.  ?   Palpations: Abdomen is soft.  ?Musculoskeletal:  ?   Cervical back: Normal range of motion and neck supple.  ?   Right lower leg: No edema.  ?   Left lower leg: No edema.  ?Skin: ?   General: Skin is warm and dry.  ?Neurological:  ?   Mental Status: She is alert and oriented to person, place, and time.  ?Psychiatric:     ?   Attention and Perception: Attention normal.     ?   Mood and Affect: Mood normal.     ?   Speech: Speech normal.     ?   Behavior: Behavior normal. Behavior is cooperative.     ?   Thought Content: Thought content normal.  ? ?Results for orders placed or performed during the hospital encounter of 12/07/21  ?SARS CORONAVIRUS 2 (TAT 6-24 HRS) Nasopharyngeal Nasopharyngeal Swab  ? Specimen: Nasopharyngeal Swab  ?Result Value Ref Range  ? SARS Coronavirus 2 NEGATIVE NEGATIVE  ? ?   ?Assessment & Plan:  ? ?Problem List Items Addressed This Visit   ? ?  ? Other  ? Generalized anxiety disorder  ?  Ongoing, stable.  Denies SI/HI.  Start Effexor 37.5  MG daily and continue Buspar 5 MG BID adjust as needed, educated her on this medication and possible side effects.  Continue Vistaril as needed for SEVERE anxiety episodes.  Educated on medications.  Recommend meditation and relaxation exercises at home + return to therapy sessions.  Therapy should offer benefit to overall mood.  Return in 6 weeks. ?  ?  ? Relevant Medications  ? venlafaxine XR (EFFEXOR XR) 37.5 MG 24 hr capsule  ?  ? ?Follow up plan: ?Return in about 6 weeks (around 01/29/2022) for Anxiety.  ?

## 2021-12-25 DIAGNOSIS — F330 Major depressive disorder, recurrent, mild: Secondary | ICD-10-CM | POA: Diagnosis not present

## 2021-12-25 DIAGNOSIS — F411 Generalized anxiety disorder: Secondary | ICD-10-CM | POA: Diagnosis not present

## 2022-01-08 DIAGNOSIS — F411 Generalized anxiety disorder: Secondary | ICD-10-CM | POA: Diagnosis not present

## 2022-01-08 DIAGNOSIS — F330 Major depressive disorder, recurrent, mild: Secondary | ICD-10-CM | POA: Diagnosis not present

## 2022-01-23 NOTE — Patient Instructions (Incomplete)
Managing Anxiety, Adult ?After being diagnosed with anxiety, you may be relieved to know why you have felt or behaved a certain way. You may also feel overwhelmed about the treatment ahead and what it will mean for your life. With care and support, you can manage this condition. ?How to manage lifestyle changes ?Managing stress and anxiety ?Stress is your body's reaction to life changes and events, both good and bad. Most stress will last just a few hours, but stress can be ongoing and can lead to more than just stress. Although stress can play a major role in anxiety, it is not the same as anxiety. Stress is usually caused by something external, such as a deadline, test, or competition. Stress normally passes after the triggering event has ended.  ?Anxiety is caused by something internal, such as imagining a terrible outcome or worrying that something will go wrong that will devastate you. Anxiety often does not go away even after the triggering event is over, and it can become long-term (chronic) worry. It is important to understand the differences between stress and anxiety and to manage your stress effectively so that it does not lead to an anxious response. ?Talk with your health care provider or a counselor to learn more about reducing anxiety and stress. He or she may suggest tension reduction techniques, such as: ?Music therapy. Spend time creating or listening to music that you enjoy and that inspires you. ?Mindfulness-based meditation. Practice being aware of your normal breaths while not trying to control your breathing. It can be done while sitting or walking. ?Centering prayer. This involves focusing on a word, phrase, or sacred image that means something to you and brings you peace. ?Deep breathing. To do this, expand your stomach and inhale slowly through your nose. Hold your breath for 3-5 seconds. Then exhale slowly, letting your stomach muscles relax. ?Self-talk. Learn to notice and identify  thought patterns that lead to anxiety reactions and change those patterns to thoughts that feel peaceful. ?Muscle relaxation. Taking time to tense muscles and then relax them. ?Choose a tension reduction technique that fits your lifestyle and personality. These techniques take time and practice. Set aside 5-15 minutes a day to do them. Therapists can offer counseling and training in these techniques. The training to help with anxiety may be covered by some insurance plans. ?Other things you can do to manage stress and anxiety include: ?Keeping a stress diary. This can help you learn what triggers your reaction and then learn ways to manage your response. ?Thinking about how you react to certain situations. You may not be able to control everything, but you can control your response. ?Making time for activities that help you relax and not feeling guilty about spending your time in this way. ?Doing visual imagery. This involves imagining or creating mental pictures to help you relax. ?Practicing yoga. Through yoga poses, you can lower tension and promote relaxation. ? ?Medicines ?Medicines can help ease symptoms. Medicines for anxiety include: ?Antidepressant medicines. These are usually prescribed for long-term daily control. ?Anti-anxiety medicines. These may be added in severe cases, especially when panic attacks occur. ?Medicines will be prescribed by a health care provider. When used together, medicines, psychotherapy, and tension reduction techniques may be the most effective treatment. ?Relationships ?Relationships can play a big part in helping you recover. Try to spend more time connecting with trusted friends and family members. ?Consider going to couples counseling if you have a partner, taking family education classes, or going to family   therapy. ?Therapy can help you and others better understand your condition. ?How to recognize changes in your anxiety ?Everyone responds differently to treatment for  anxiety. Recovery from anxiety happens when symptoms decrease and stop interfering with your daily activities at home or work. This may mean that you will start to: ?Have better concentration and focus. Worry will interfere less in your daily thinking. ?Sleep better. ?Be less irritable. ?Have more energy. ?Have improved memory. ?It is also important to recognize when your condition is getting worse. Contact your health care provider if your symptoms interfere with home or work and you feel like your condition is not improving. ?Follow these instructions at home: ?Activity ?Exercise. Adults should do the following: ?Exercise for at least 150 minutes each week. The exercise should increase your heart rate and make you sweat (moderate-intensity exercise). ?Strengthening exercises at least twice a week. ?Get the right amount and quality of sleep. Most adults need 7-9 hours of sleep each night. ?Lifestyle ? ?Eat a healthy diet that includes plenty of vegetables, fruits, whole grains, low-fat dairy products, and lean protein. ?Do not eat a lot of foods that are high in fats, added sugars, or salt (sodium). ?Make choices that simplify your life. ?Do not use any products that contain nicotine or tobacco. These products include cigarettes, chewing tobacco, and vaping devices, such as e-cigarettes. If you need help quitting, ask your health care provider. ?Avoid caffeine, alcohol, and certain over-the-counter cold medicines. These may make you feel worse. Ask your pharmacist which medicines to avoid. ?General instructions ?Take over-the-counter and prescription medicines only as told by your health care provider. ?Keep all follow-up visits. This is important. ?Where to find support ?You can get help and support from these sources: ?Self-help groups. ?Online and community organizations. ?A trusted spiritual leader. ?Couples counseling. ?Family education classes. ?Family therapy. ?Where to find more information ?You may find  that joining a support group helps you deal with your anxiety. The following sources can help you locate counselors or support groups near you: ?Mental Health America: www.mentalhealthamerica.net ?Anxiety and Depression Association of America (ADAA): www.adaa.org ?National Alliance on Mental Illness (NAMI): www.nami.org ?Contact a health care provider if: ?You have a hard time staying focused or finishing daily tasks. ?You spend many hours a day feeling worried about everyday life. ?You become exhausted by worry. ?You start to have headaches or frequently feel tense. ?You develop chronic nausea or diarrhea. ?Get help right away if: ?You have a racing heart and shortness of breath. ?You have thoughts of hurting yourself or others. ?If you ever feel like you may hurt yourself or others, or have thoughts about taking your own life, get help right away. Go to your nearest emergency department or: ?Call your local emergency services (911 in the [place] ?Call a suicide crisis helpline, such as the National Suicide Prevention Lifeline at 1-800-273-8255 or 988 in the [place] This is open 24 hours a day in the [place] ?Text the Crisis Text Line at 741741 (in the [place] ?Summary ?Taking steps to learn and use tension reduction techniques can help calm you and help prevent triggering an anxiety reaction. ?When used together, medicines, psychotherapy, and tension reduction techniques may be the most effective treatment. ?Family, friends, and partners can play a big part in supporting you. ?This information is not intended to replace advice given to you by your health care provider. Make sure you discuss any questions you have with your health care provider. ?Document Revised: 04/29/2021 Document Reviewed: 01/25/2021 ?Elsevier Patient   Education ? 2022 Elsevier Inc. ? ?

## 2022-01-27 ENCOUNTER — Ambulatory Visit
Admission: RE | Admit: 2022-01-27 | Discharge: 2022-01-27 | Disposition: A | Discharge status: Home / Self Care | Payer: Federal, State, Local not specified - PPO | Source: Ambulatory Visit | Attending: Emergency Medicine | Admitting: Emergency Medicine

## 2022-01-27 ENCOUNTER — Ambulatory Visit
Admission: EM | Admit: 2022-01-27 | Discharge: 2022-01-27 | Disposition: A | Discharge status: Home / Self Care | Payer: Federal, State, Local not specified - PPO | Attending: Emergency Medicine | Admitting: Emergency Medicine

## 2022-01-27 DIAGNOSIS — R1031 Right lower quadrant pain: Secondary | ICD-10-CM

## 2022-01-27 NOTE — ED Triage Notes (Signed)
Pt present right lower quadrant abdomen pain. Symptoms started this morning. Pt states that she feel high pressure on the right side.  ?

## 2022-01-27 NOTE — Discharge Instructions (Addendum)
You have an abdominal ultrasound at 230 today  ? ?If a ruptured cyst is noted on exam and your pain is still present you will be sent to the emergency department for further evaluation ? ?If a ruptured cyst is noted on exam and your pain has resolved you will follow-up with gynecology ? ?May use Zofran as directed from your prescriber for if nausea returns ? ?You may use 500 to 1000 mg of Tylenol and/or 800 mg of ibuprofen every 6-8 hours for comfort ? ?You may place a heating pad over the affected area in 10 to 15-minute intervals for additional support ?

## 2022-01-27 NOTE — ED Provider Notes (Signed)
?[facility] ? ? ? ?CSN: [id number] ?Arrival date & time: 01/27/22  0847 ? ? ?  ? ?History   ?Chief Complaint ?Chief Complaint  ?Patient presents with  ? Abdominal Pain  ? ? ?HPI ?Denijah Karrer is a 21 y.o. female.  ? ?Patient presents with right lower abdominal pain and vomiting beginning this morning.  Pain is described as sharp, does not radiate.  Rating pain a 6 out of 10 .has not attempted to eat or drink.  Pain is worsened by movement.  Has not attempted treatment.  Denies diarrhea or constipation, heartburn or indigestion, bleeding, increased gas production.  History of a ovarian cyst with rupture.   ? ? ?Past Medical History:  ?Diagnosis Date  ? Abdominal migraine   ? Anxiety   ? ? ?Patient Active Problem List  ? Diagnosis Date Noted  ? Frequent UTI 05/26/2021  ? Tinea pedis 05/26/2021  ? Unintentional weight loss 01/26/2021  ? 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 Generalized anxiety disorder 11/16/2019  ? Healthy female adolescent 08/28/2019  ? Uses birth control 08/28/2019  ? ? ?Past Surgical History:  ?Procedure Laterality Date  ? COLONOSCOPY WITH PROPOFOL N/A 09/17/2021  ? Procedure: COLONOSCOPY WITH PROPOFOL;  Surgeon: Virgel Manifold, MD;  Location: [facility] ENDOSCOPY;  Service: Endoscopy;  Laterality: N/A;  ? NO PAST SURGERIES    ? ? ?OB History   ?No obstetric history on file. ?  ? ? ? ?Home Medications   ? ?Prior to Admission medications   ?Medication Sig Start Date End Date Taking? Authorizing Provider  ?busPIRone (BUSPAR) 5 MG tablet Take 1 tablet (5 mg total) by mouth 2 (two) times daily. 05/08/21   Marnee Guarneri T, NP  ?hydrOXYzine (ATARAX/VISTARIL) 10 MG tablet Take 1 tablet (10 mg total) by mouth 3 (three) times daily as needed. 11/16/19   Marnee Guarneri T, NP  ?levonorgestrel-ethinyl estradiol (LILLOW) 0.15-30 MG-MCG tablet Take 1 tablet by mouth daily. 04/08/21   Marnee Guarneri T, NP  ?venlafaxine XR (EFFEXOR XR) 37.5 MG 24 hr capsule Take 1 capsule (37.5 mg total) by mouth daily with  breakfast. 12/18/21   Venita Lick, NP  ? ? ?Family History ?Family History  ?Problem Relation Age of Onset  ? Anxiety disorder Mother   ? Depression Mother   ? Anxiety disorder Father   ? Depression Father   ? Pectus excavatum Sister   ? Breast cancer Maternal Grandmother   ? Pectus excavatum Adoptive Father   ? ? ?Social History ?Social History  ? ?Tobacco Use  ? Smoking status: Never  ? Smokeless tobacco: Never  ?Vaping Use  ? Vaping Use: Never used  ?Substance Use Topics  ? Alcohol use: Yes  ?  Comment: soccially  ? Drug use: Never  ? ? ? ?Allergies   ?Patient has no known allergies. ? ? ?Review of Systems ?Review of Systems  ?Constitutional: Negative.   ?HENT: Negative.    ?Respiratory: Negative.    ?Cardiovascular: Negative.   ?Gastrointestinal:  Positive for abdominal pain, nausea and vomiting. Negative for abdominal distention, anal bleeding, blood in stool, constipation, diarrhea and rectal pain.  ?Genitourinary: Negative.   ?Skin: Negative.   ?Neurological: Negative.   ? ? ?Physical Exam ?Triage Vital Signs ?ED Triage Vitals  ?Enc Vitals Group  ?   BP 01/27/22 0903 108/75  ?   Pulse Rate 01/27/22 0903 91  ?   Resp 01/27/22 0903 18  ?   Temp 01/27/22 0903 98.5 ?F (36.9 ?C)  ?  Temp Source 01/27/22 0903 Oral  ?   SpO2 01/27/22 0903 99 %  ?   Weight --   ?   Height --   ?   Head Circumference --   ?   Peak Flow --   ?   Pain Score 01/27/22 0904 6  ?   Pain Loc --   ?   Pain Edu? --   ?   Excl. in [place]? --   ? ?No data found. ? ?Updated Vital Signs ?BP 108/75 (BP Location: Left Arm)   Pulse 91   Temp 98.5 ?F (36.9 ?C) (Oral)   Resp 18   LMP 01/06/2022   SpO2 99%  ? ?Visual Acuity ?Right Eye Distance:   ?Left Eye Distance:   ?Bilateral Distance:   ? ?Right Eye Near:   ?Left Eye Near:    ?Bilateral Near:    ? ?Physical Exam ?Constitutional:   ?   Appearance: She is well-developed.  ?HENT:  ?   Head: Normocephalic.  ?Eyes:  ?   Extraocular Movements: Extraocular movements intact.  ?Pulmonary:  ?   Effort:  Pulmonary effort is normal.  ?Abdominal:  ?   General: Abdomen is flat. Bowel sounds are normal.  ?   Palpations: Abdomen is soft. There is no mass.  ?   Tenderness: There is abdominal tenderness in the right lower quadrant. There is no right CVA tenderness or left CVA tenderness.  ?Skin: ?   General: Skin is warm and dry.  ?Neurological:  ?   General: No focal deficit present.  ?   Mental Status: She is alert and oriented to person, place, and time.  ?Psychiatric:     ?   Mood and Affect: Mood normal.     ?   Behavior: Behavior normal.  ? ? ? ?UC Treatments / Results  ?Labs ?(all labs ordered are listed, but only abnormal results are displayed) ?Labs Reviewed - No data to display ? ?EKG ? ? ?Radiology ?No results found. ? ?Procedures ?Procedures (including critical care time) ? ?Medications Ordered in UC ?Medications - No data to display ? ?Initial Impression / Assessment and Plan / UC Course  ?I have reviewed the triage vital signs and the nursing notes. ? ?Pertinent labs & imaging results that were available during my care of the patient were reviewed by me and considered in my medical decision making (see chart for details). ? ?Right lower quadrant abdominal pain ? ? ?vital signs are stable and while tenderness is noted on exam the patient is in no signs of distress, low suspicion for an acute abdomen, etiology is most likely viral, pelvic ultrasound negative, discussed results with patient via telephone, 2 patient identifiers use, ultrasound was completed hours after symptoms initially began, patient endorses that abdominal pain has begun to resolve, recommended continued monitoring at home, may use Tylenol or ibuprofen as needed, given strict precautions that if symptoms return and increase in severity or intensity she is to go to the nearest emergency department ?Final Clinical Impressions(s) / UC Diagnoses  ? ?Final diagnoses:  ?None  ? ?Discharge Instructions   ?None ?  ? ?ED Prescriptions   ?None ?   ? ?PDMP not reviewed this encounter. ?  ?Hans Eden, NP ?01/29/22 6578 ? ?

## 2022-01-29 ENCOUNTER — Ambulatory Visit: Payer: Federal, State, Local not specified - PPO | Admitting: Nurse Practitioner

## 2022-02-02 ENCOUNTER — Ambulatory Visit: Payer: Federal, State, Local not specified - PPO | Admitting: Gastroenterology

## 2022-02-05 ENCOUNTER — Encounter: Payer: Self-pay | Admitting: Nurse Practitioner

## 2022-02-05 DIAGNOSIS — R41840 Attention and concentration deficit: Secondary | ICD-10-CM

## 2022-02-05 DIAGNOSIS — F411 Generalized anxiety disorder: Secondary | ICD-10-CM

## 2022-02-05 MED ORDER — NORETHIN ACE-ETH ESTRAD-FE 1-20 MG-MCG PO TABS: 1.0000 | ORAL_TABLET | Freq: Every day | ORAL | 2 refills | Status: DC

## 2022-02-10 ENCOUNTER — Telehealth (INDEPENDENT_AMBULATORY_CARE_PROVIDER_SITE_OTHER): Payer: Federal, State, Local not specified - PPO | Admitting: Nurse Practitioner

## 2022-02-10 ENCOUNTER — Encounter: Payer: Self-pay | Admitting: Nurse Practitioner

## 2022-02-10 DIAGNOSIS — L089 Local infection of the skin and subcutaneous tissue, unspecified: Secondary | ICD-10-CM | POA: Insufficient documentation

## 2022-02-10 MED ORDER — SULFAMETHOXAZOLE-TRIMETHOPRIM 800-160 MG PO TABS: 1.0000 | ORAL_TABLET | Freq: Two times a day (BID) | ORAL | 0 refills | Status: DC

## 2022-02-10 MED ORDER — MUPIROCIN 2 % EX OINT: 1.0000 "application " | TOPICAL_OINTMENT | Freq: Two times a day (BID) | CUTANEOUS | 0 refills | Status: DC

## 2022-02-10 NOTE — Patient Instructions (Signed)
Skin Abscess ? ?A skin abscess is an infected area of your skin that contains pus and other material. An abscess can happen in any part of your body. Some abscesses break open (rupture) on their own. Most continue to get worse unless they are treated. The infection can spread deeper into the body and into your blood, which can make you feel sick. ?A skin abscess is caused by germs that enter the skin through a cut or scrape. It can also be caused by blocked oil and sweat glands or infected hair follicles. ?This condition is usually treated by: ?Draining the pus. ?Taking antibiotic medicines. ?Placing a warm, wet washcloth over the abscess. ?Follow these instructions at home: ?Medicines ? ?Take over-the-counter and prescription medicines only as told by your doctor. ?If you were prescribed an antibiotic medicine, take it as told by your doctor. Do not stop taking the antibiotic even if you start to feel better. ?Abscess care ? ?If you have an abscess that has not drained, place a warm, clean, wet washcloth over the abscess several times a day. Do this as told by your doctor. ?Follow instructions from your doctor about how to take care of your abscess. Make sure you: ?Cover the abscess with a bandage (dressing). ?Change your bandage or gauze as told by your doctor. ?Wash your hands with soap and water before you change the bandage or gauze. If you cannot use soap and water, use hand sanitizer. ?Check your abscess every day for signs that the infection is getting worse. Check for: ?More redness, swelling, or pain. ?More fluid or blood. ?Warmth. ?More pus or a bad smell. ?General instructions ?To avoid spreading the infection: ?Do not share personal care items, towels, or hot tubs with others. ?Avoid making skin-to-skin contact with other people. ?Keep all follow-up visits as told by your doctor. This is important. ?Contact a doctor if: ?You have more redness, swelling, or pain around your abscess. ?You have more fluid  or blood coming from your abscess. ?Your abscess feels warm when you touch it. ?You have more pus or a bad smell coming from your abscess. ?Your muscles ache. ?You feel sick. ?Get help right away if: ?You have very bad (severe) pain. ?You see red streaks on your skin spreading away from the abscess. ?You see redness that spreads quickly. ?You have a fever or chills. ?Summary ?A skin abscess is an infected area of your skin that contains pus and other material. ?The abscess is caused by germs that enter the skin through a cut or scrape. It can also be caused by blocked oil and sweat glands or infected hair follicles. ?Follow your doctor's instructions on caring for your abscess, taking medicines, preventing infections, and keeping follow-up visits. ?This information is not intended to replace advice given to you by your health care provider. Make sure you discuss any questions you have with your health care provider. ?Document Revised: 07/13/2021 Document Reviewed: 07/13/2021 ?Elsevier Patient Education ? [place]. ? ?

## 2022-02-10 NOTE — Telephone Encounter (Signed)
Pt scheduled at 1:40 today. States that she has to be done by 2 pm ?

## 2022-02-10 NOTE — Progress Notes (Signed)
? ?There were no vitals taken for this visit.  ? ?Subjective:  ? ? Patient ID: Brenda Rangel, female    DOB: 11-09-2000, 21 y.o.   MRN: [id number] ? ?HPI: ?Brenda Rangel is a 21 y.o. female ? ?Chief Complaint  ?Patient presents with  ? Wound Infection  ?  Patient is here to be seen for infection surrounding her belly button piercing.   ? ?This visit was completed via video visit through MyChart due to the restrictions of the COVID-19 pandemic. All issues as above were discussed and addressed. Physical exam was done as above through visual confirmation on video through MyChart. If it was felt that the patient should be evaluated in the office, they were directed there. The patient verbally consented to this visit. ?Location of the patient: home ?Location of the provider: work ?Those involved with this call:  ?Provider: Marnee Guarneri, DNP ?CMA: Irena Reichmann, CMA ?Front Desk/Registration: [employer]  ?Time spent on call:  21 minutes with patient face to face via video conference. More than 50% of this time was spent in counseling and coordination of care. 15 minutes total spent in review of patient's record and preparation of their chart.  ?I verified patient identity using two factors (patient name and date of birth). Patient consents verbally to being seen via telemedicine visit today.   ? ?SKIN INFECTION ?Presents for infection to [employer], she got this last August 2022.  She wore it for 6 months with occasional scabbing and drainage.  In February she took piercing out and cleaned area + put new ring in area.  Last night it started draining and bleeding from both sides. ?Duration: days ?Location: naval area ?History of trauma in area: no ?Pain: no ?Quality:  no pain ?Severity: mild ?Redness: yes ?Swelling: yes ?Oozing: yes ?Pus: yes ?Fevers: no ?Nausea/vomiting: no ?Status: fluctuating ?Treatments attempted:antibiotics  ?Tetanus: UTD  ? ?Relevant past medical, surgical, family and social history  reviewed and updated as indicated. Interim medical history since our last visit reviewed. ?Allergies and medications reviewed and updated. ? ?Review of Systems  ?Constitutional:  Negative for activity change, appetite change, diaphoresis, fatigue and fever.  ?Respiratory:  Negative for cough, chest tightness and shortness of breath.   ?Cardiovascular:  Negative for chest pain, palpitations and leg swelling.  ?Skin:  Positive for wound.  ?Psychiatric/Behavioral:  Negative for decreased concentration, self-injury, sleep disturbance and suicidal ideas. The patient is not nervous/anxious.   ? ?Per HPI unless specifically indicated above ? ?   ?Objective:  ?  ?There were no vitals taken for this visit.  ?Wt Readings from Last 3 Encounters:  ?12/18/21 124 lb (56.2 kg)  ?12/07/21 121 lb 7.6 oz (55.1 kg)  ?08/13/21 121 lb 6.4 oz (55.1 kg)  ?  ?Physical Exam ?Vitals and nursing note reviewed.  ?Constitutional:   ?   General: She is awake. She is not in acute distress. ?   Appearance: She is well-developed. She is not ill-appearing.  ?HENT:  ?   Head: Normocephalic.  ?   Right Ear: Hearing normal.  ?   Left Ear: Hearing normal.  ?Eyes:  ?   General: Lids are normal.     ?   Right eye: No discharge.     ?   Left eye: No discharge.  ?   Conjunctiva/sclera: Conjunctivae normal.  ?Pulmonary:  ?   Effort: Pulmonary effort is normal. No accessory muscle usage or respiratory distress.  ?Musculoskeletal:  ?   Cervical back: Normal range  of motion.  ?Neurological:  ?   Mental Status: She is alert and oriented to person, place, and time.  ?Psychiatric:     ?   Attention and Perception: Attention normal.     ?   Mood and Affect: Mood normal.     ?   Behavior: Behavior normal. Behavior is cooperative.     ?   Thought Content: Thought content normal.     ?   Judgment: Judgment normal.  ? ? ?Results for orders placed or performed during the hospital encounter of 12/07/21  ?SARS CORONAVIRUS 2 (TAT 6-24 HRS) Nasopharyngeal Nasopharyngeal  Swab  ? Specimen: Nasopharyngeal Swab  ?Result Value Ref Range  ? SARS Coronavirus 2 NEGATIVE NEGATIVE  ? ?   ?Assessment & Plan:  ? ?Problem List Items Addressed This Visit   ? ?  ? Musculoskeletal and Integument  ? Skin infection  ?  To naval area from piercing.  Will send in Bactrim DS 1 tablet BID for 10 days and Mupirocin.  Recommend warm compresses to area and keeping clean.  May maintain piercing in at this time to keep area open, but if worsening she may need to remove.  Follow-up schedule next week. ? ?  ?  ? Relevant Medications  ? sulfamethoxazole-trimethoprim (BACTRIM DS) 800-160 MG tablet  ? mupirocin ointment (BACTROBAN) 2 %  ?  ?I discussed the assessment and treatment plan with the patient. The patient was provided an opportunity to ask questions and all were answered. The patient agreed with the plan and demonstrated an understanding of the instructions. ? ? ?The patient was advised to call back or seek an in-person evaluation if the symptoms worsen or if the condition fails to improve as anticipated. ? ? ?I provided 21+ minutes of time during this encounter.  ? ?Follow up plan: ?Return if symptoms worsen or fail to improve. ? ? ? ? ? ?

## 2022-02-10 NOTE — Assessment & Plan Note (Signed)
To naval area from piercing.  Will send in Bactrim DS 1 tablet BID for 10 days and Mupirocin.  Recommend warm compresses to area and keeping clean.  May maintain piercing in at this time to keep area open, but if worsening she may need to remove.  Follow-up schedule next week. ?

## 2022-02-12 DIAGNOSIS — F330 Major depressive disorder, recurrent, mild: Secondary | ICD-10-CM | POA: Diagnosis not present

## 2022-02-12 DIAGNOSIS — F411 Generalized anxiety disorder: Secondary | ICD-10-CM | POA: Diagnosis not present

## 2022-02-14 NOTE — Patient Instructions (Signed)

## 2022-02-19 ENCOUNTER — Encounter: Payer: Self-pay | Admitting: Nurse Practitioner

## 2022-02-19 ENCOUNTER — Ambulatory Visit: Payer: Federal, State, Local not specified - PPO | Admitting: Nurse Practitioner

## 2022-02-19 VITALS — BP 94/61 | HR 88 | Temp 98.1°F | Ht 63.0 in | Wt 120.6 lb

## 2022-02-19 DIAGNOSIS — F411 Generalized anxiety disorder: Secondary | ICD-10-CM

## 2022-02-19 DIAGNOSIS — Z5189 Encounter for other specified aftercare: Secondary | ICD-10-CM | POA: Diagnosis not present

## 2022-02-19 MED ORDER — VENLAFAXINE HCL ER 75 MG PO CP24: 75.0000 mg | ORAL_CAPSULE | Freq: Every day | ORAL | 12 refills | Status: DC

## 2022-02-19 NOTE — Progress Notes (Signed)
? ?BP 94/61   Pulse 88   Temp 98.1 ?F (36.7 ?C) (Oral)   Ht '5\' 3"'$  (1.6 m)   Wt 120 lb 9.6 oz (54.7 kg)   SpO2 98%   BMI 21.36 kg/m?   ? ?Subjective:  ? ? Patient ID: Brenda Rangel, female    DOB: 2001/05/22, 21 y.o.   MRN: [id number] ? ?HPI: ?Brenda Rangel is a 21 y.o. female ? ?Chief Complaint  ?Patient presents with  ? Anxiety  ?  Patient is here to follow up on Anxiety. Patient states she is not doing any better. Patient states it is kind about the same and she feels that it has gotten worse and she feels that it has been caused by surrounding factors.   ? Wound Check  ?  Patient states she was prescribed Bactroban and rarely had to use the prescription as the piercing fell out and closed up.   ? ?SKIN INFECTION ?To area where had belly button pierced -- ended up having infection there.  Piercing fell out and is no longer in. ?Duration: days ?Location: navel area ?History of trauma in area: no ?Redness: no ?Swelling: no ?Oozing: no ?Pus: no ?Fevers: no ?Nausea/vomiting: no ?Status: better ?Treatments attempted:antibiotics and warm compresses  ?Tetanus: UTD  ? ?ANXIETY/STRESS ?Started Effexor in March 2023 (tolerating well) and continues Buspar 5 MG BID.  Is in school at [employer] and has a lot of responsibilities that are overwhelming for her -- she is interested to see how she does when out of school.  Has tried Zoloft and Prozac in past with no benefit. She is attending counseling, [facility].  Her mom thinks she needs to go to trauma therapy, patient reports she has been through trauma. ?Duration: ongoing ?Anxious mood: yes ?Excessive worrying: yes ?Irritability: yes ?Sweating: no ?Nausea: no ?Palpitations: none ?Hyperventilation: no ?Panic attacks: x 1 recently ?Agoraphobia: no  ?Obscessions/compulsions: occasional ?Depressed mood: no ? ?  2022/03/08  ? 10:37 AM 12/18/2021  ?  4:18 PM 08/13/2021  ?  9:29 AM 07/31/2021  ?  2:02 PM 06/19/2021  ?  4:11 PM  ?Depression screen PHQ 2/9  ?Decreased Interest  '3 3 2 3 '$ 0  ?Down, Depressed, Hopeless '2 2 1 2 '$ 0  ?PHQ - 2 Score '5 5 3 5 '$ 0  ?Altered sleeping '1 1 3 2 1  '$ ?Tired, decreased energy '3 3 3 2 1  '$ ?Change in appetite '2 3 2 2 '$ 0  ?Feeling bad or failure about yourself  '1 1 1 2 '$ 0  ?Trouble concentrating '3 2 2 3 '$ 0  ?Moving slowly or fidgety/restless '2 2 2 2 '$ 0  ?Suicidal thoughts 0 0 0 0 0  ?PHQ-9 Score '17 17 16 18 2  '$ ?Difficult doing work/chores Very difficult Somewhat difficult Very difficult  Not difficult at all  ?Anhedonia: no ?Weight changes: no ?Insomnia: yes hard to stay asleep  ?Hypersomnia: no ?Fatigue/loss of energy: no ?Feelings of worthlessness: no ?Feelings of guilt: no ?Impaired concentration/indecisiveness: yes ?Suicidal ideations: no  ?Crying spells: no ?Recent Stressors/Life Changes: yes ?  Relationship problems: no ?  Family stress: yes   ?  Financial stress: no  ?  Job stress: yes ?  Recent death/loss: no ? ?  Mar 08, 2022  ? 10:36 AM 12/18/2021  ?  4:19 PM 08/13/2021  ?  9:29 AM 07/31/2021  ?  2:02 PM  ?GAD 7 : Generalized Anxiety Score  ?Nervous, Anxious, on Edge '3 1 3 3  '$ ?Control/stop worrying 3 1 3  3  ?Worry too much - different things '3 3 3 3  '$ ?Trouble relaxing '3 3 2 3  '$ ?Restless '3 3 2 2  '$ ?Easily annoyed or irritable '3 3 3 3  '$ ?Afraid - awful might happen 1 0 1 1  ?Total GAD 7 Score '19 14 17 18  '$ ?Anxiety Difficulty Very difficult Somewhat difficult Very difficult Very difficult  ? ? ?Relevant past medical, surgical, family and social history reviewed and updated as indicated. Interim medical history since our last visit reviewed. ?Allergies and medications reviewed and updated. ? ?Review of Systems  ?Constitutional:  Negative for activity change, appetite change, diaphoresis, fatigue and fever.  ?Respiratory:  Negative for cough, chest tightness and shortness of breath.   ?Cardiovascular:  Negative for chest pain, palpitations and leg swelling.  ?Psychiatric/Behavioral:  Negative for decreased concentration, self-injury, sleep disturbance and suicidal ideas.  The patient is nervous/anxious.   ? ?Per HPI unless specifically indicated above ? ?   ?Objective:  ?  ?BP 94/61   Pulse 88   Temp 98.1 ?F (36.7 ?C) (Oral)   Ht '5\' 3"'$  (1.6 m)   Wt 120 lb 9.6 oz (54.7 kg)   SpO2 98%   BMI 21.36 kg/m?   ?Wt Readings from Last 3 Encounters:  ?02/19/22 120 lb 9.6 oz (54.7 kg)  ?12/18/21 124 lb (56.2 kg)  ?12/07/21 121 lb 7.6 oz (55.1 kg)  ?  ?Physical Exam ?Vitals and nursing note reviewed.  ?Constitutional:   ?   General: She is awake. She is not in acute distress. ?   Appearance: She is well-developed and well-groomed. She is not ill-appearing.  ?HENT:  ?   Head: Normocephalic.  ?   Right Ear: Hearing normal.  ?   Left Ear: Hearing normal.  ?Eyes:  ?   General: Lids are normal.     ?   Right eye: No discharge.     ?   Left eye: No discharge.  ?   Conjunctiva/sclera: Conjunctivae normal.  ?   Pupils: Pupils are equal, round, and reactive to light.  ?Neck:  ?   Thyroid: No thyromegaly.  ?   Vascular: No carotid bruit.  ?Cardiovascular:  ?   Rate and Rhythm: Normal rate and regular rhythm.  ?   Heart sounds: Normal heart sounds. No murmur heard. ?  No gallop.  ?Pulmonary:  ?   Effort: Pulmonary effort is normal. No accessory muscle usage or respiratory distress.  ?   Breath sounds: Normal breath sounds.  ?Abdominal:  ?   General: Bowel sounds are normal.  ?   Palpations: Abdomen is soft.  ?Musculoskeletal:  ?   Cervical back: Normal range of motion and neck supple.  ?   Right lower leg: No edema.  ?   Left lower leg: No edema.  ?Skin: ?   General: Skin is warm and dry.  ?   Findings: Wound present.  ? ?    ?Neurological:  ?   Mental Status: She is alert and oriented to person, place, and time.  ?Psychiatric:     ?   Attention and Perception: Attention normal.     ?   Mood and Affect: Mood normal.     ?   Speech: Speech normal.     ?   Behavior: Behavior normal. Behavior is cooperative.     ?   Thought Content: Thought content normal.  ? ?Results for orders placed or performed  during the hospital encounter of 12/07/21  ?SARS  CORONAVIRUS 2 (TAT 6-24 HRS) Nasopharyngeal Nasopharyngeal Swab  ? Specimen: Nasopharyngeal Swab  ?Result Value Ref Range  ? SARS Coronavirus 2 NEGATIVE NEGATIVE  ? ?   ?Assessment & Plan:  ? ?Problem List Items Addressed This Visit   ? ?  ? Other  ? Generalized anxiety disorder - Primary  ?  Ongoing, stable.  Denies SI/HI.  Increase Effexor XR to 75 MG daily and continue Buspar 5 MG BID adjust as needed, educated her on this medication and possible side effects.  Continue Vistaril as needed for SEVERE anxiety episodes.  Educated on medications.  Recommend meditation and relaxation exercises at home + continue therapy sessions.  Return in 6 weeks. ? ?  ?  ? Relevant Medications  ? venlafaxine XR (EFFEXOR XR) 75 MG 24 hr capsule  ? ?Other Visit Diagnoses   ? ? Wound check, abscess      ? [place] area has fully healed over, no further interventions needed.  ? ?  ?  ? ?Follow up plan: ?Return in about 6 weeks (around 04/02/2022) for ANXIETY.  ?

## 2022-02-19 NOTE — Assessment & Plan Note (Signed)
Ongoing, stable.  Denies SI/HI.  Increase Effexor XR to 75 MG daily and continue Buspar 5 MG BID adjust as needed, educated her on this medication and possible side effects.  Continue Vistaril as needed for SEVERE anxiety episodes.  Educated on medications.  Recommend meditation and relaxation exercises at home + continue therapy sessions.  Return in 6 weeks. ?

## 2022-02-19 NOTE — Addendum Note (Signed)
Addended by: Marnee Guarneri T on: 02/19/2022 12:54 PM ? ? Modules accepted: Orders ? ?

## 2022-03-14 IMAGING — CT CT ABD-PELV W/O CM
1 of 2 series · 15 of 32 positions shown, 19 images · non-contrast
Comparison: 09/12/2018

CLINICAL DATA: Microhematuria.  Low back pain

EXAM:
CT ABDOMEN AND PELVIS WITHOUT CONTRAST
TECHNIQUE: Multidetector CT imaging of the abdomen and pelvis was performed
following the standard protocol without IV contrast.

[Series 2: axial st · axial · 0.65mm/px · z∈[-1174,-754]mm · 15 of 92 slices shown, 19 images]
[im 4/92  soft-tissue]
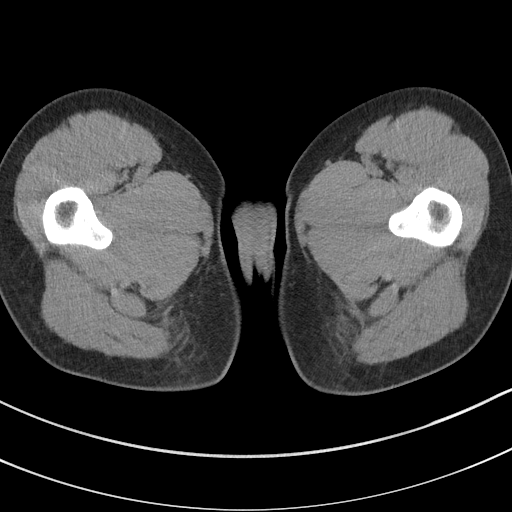
[im 4/92  bone]
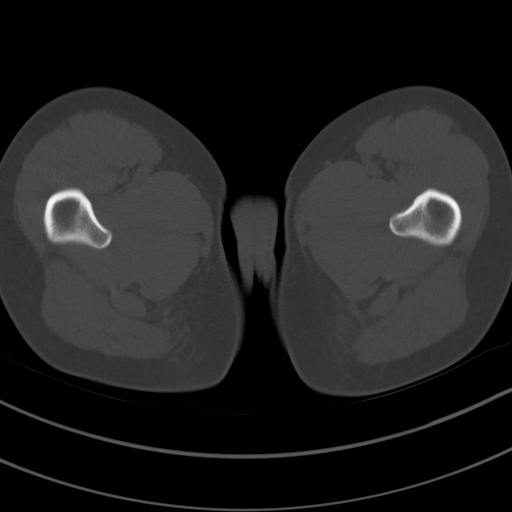
[im 11/92  soft-tissue]
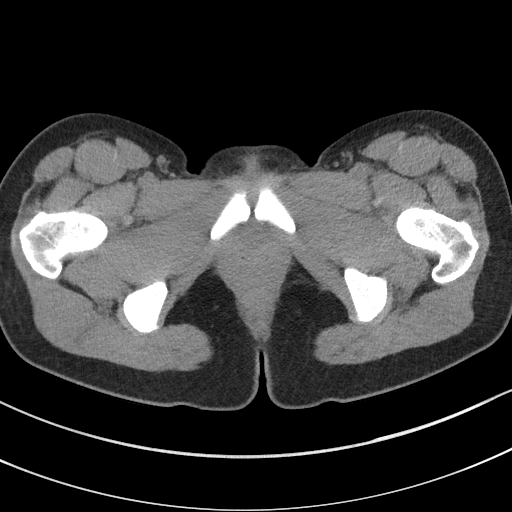
[im 19/92  soft-tissue]
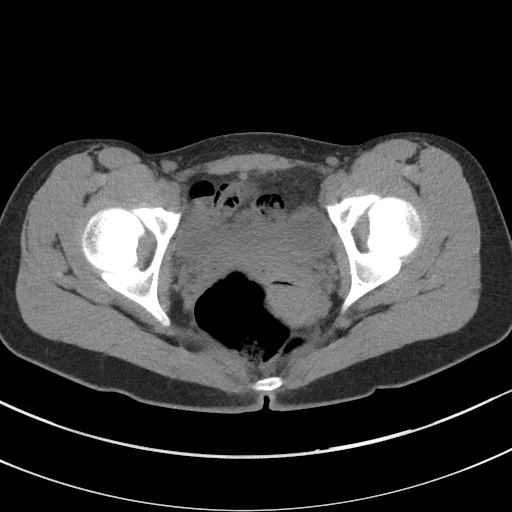
[im 26/92  soft-tissue]
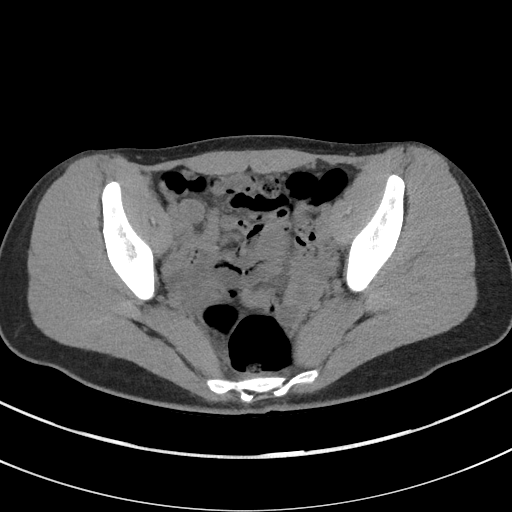
[im 33/92  soft-tissue]
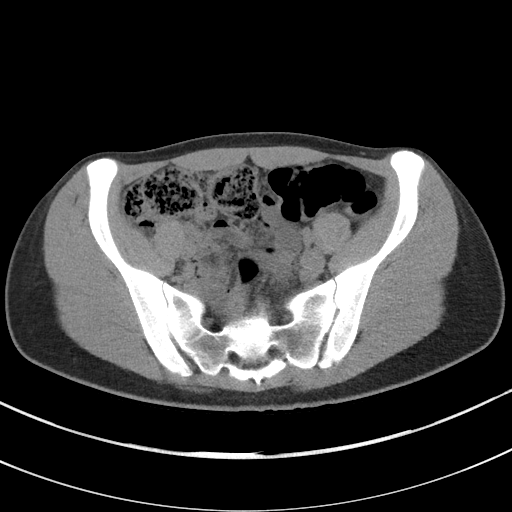
[im 41/92  soft-tissue]
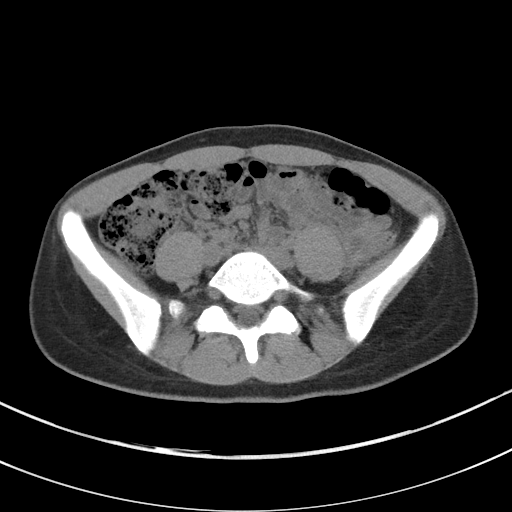
[im 48/92  soft-tissue]
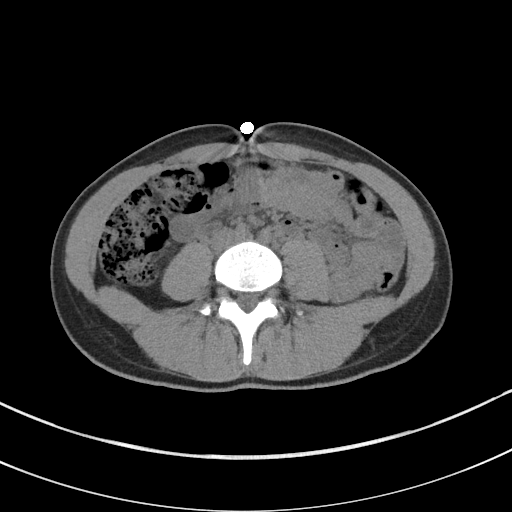
[im 51/92  soft-tissue]
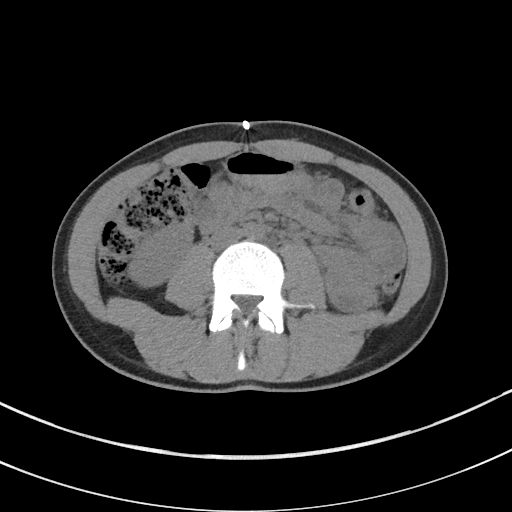
[im 59/92  soft-tissue]
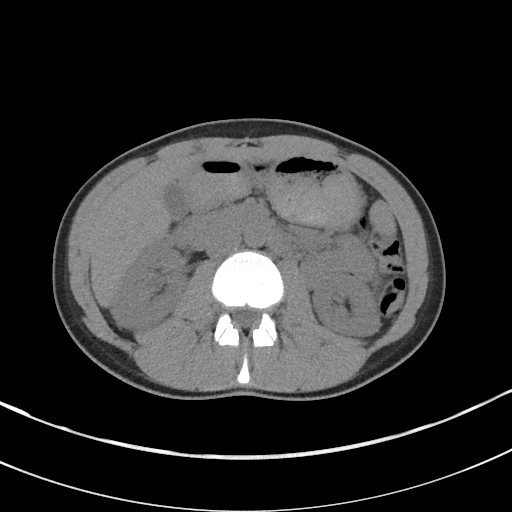
[im 59/92  bone]
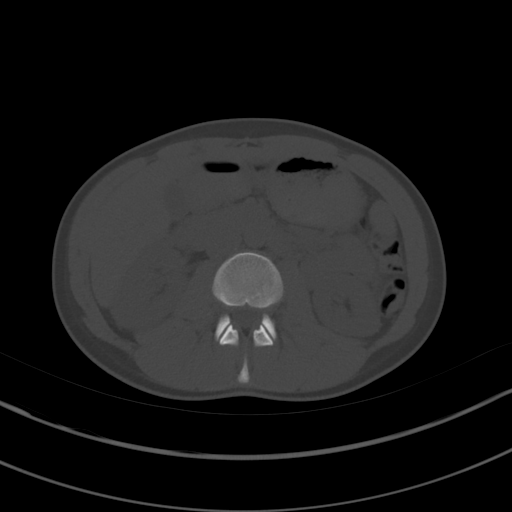
[im 66/92  soft-tissue]
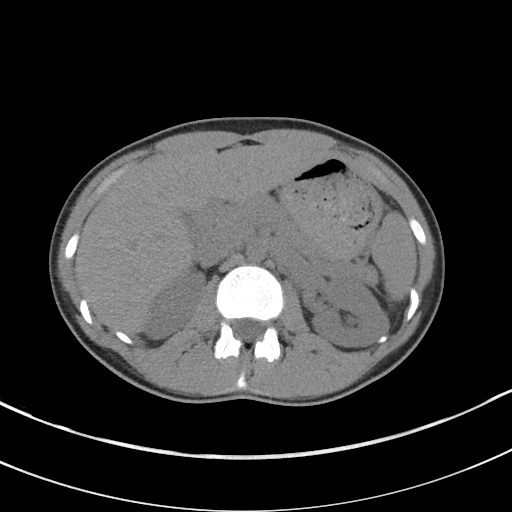
[im 73/92  soft-tissue]
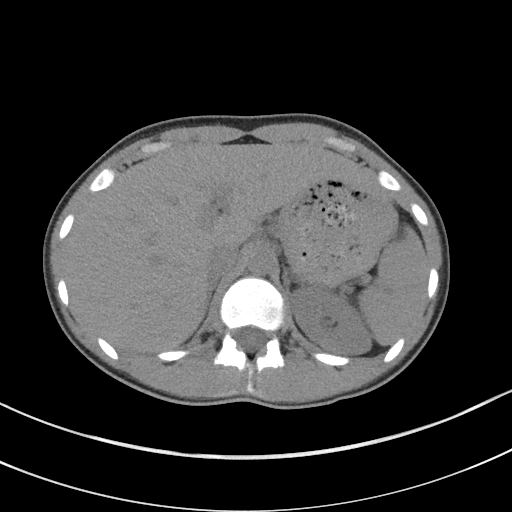
[im 77/92  lung]
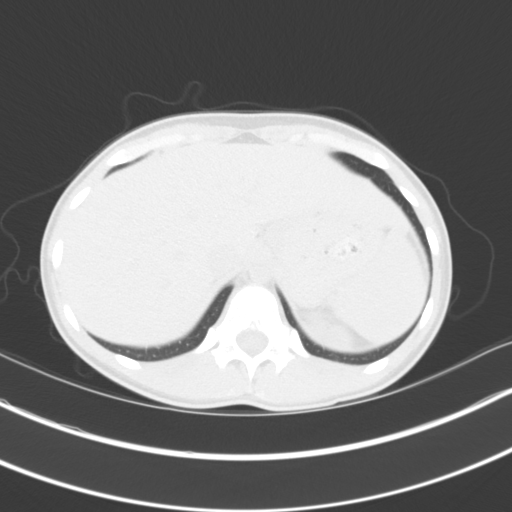
[im 81/92  soft-tissue]
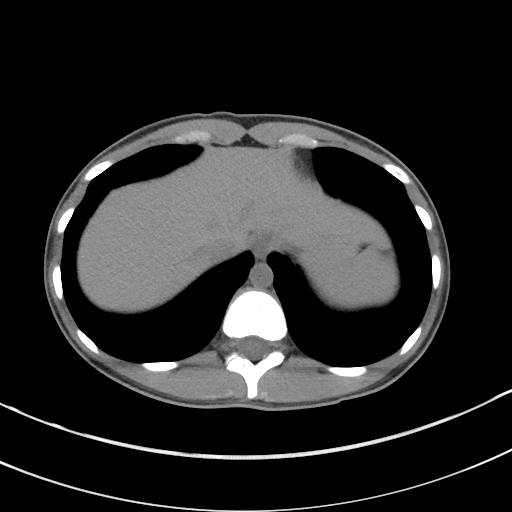
[im 81/92  lung]
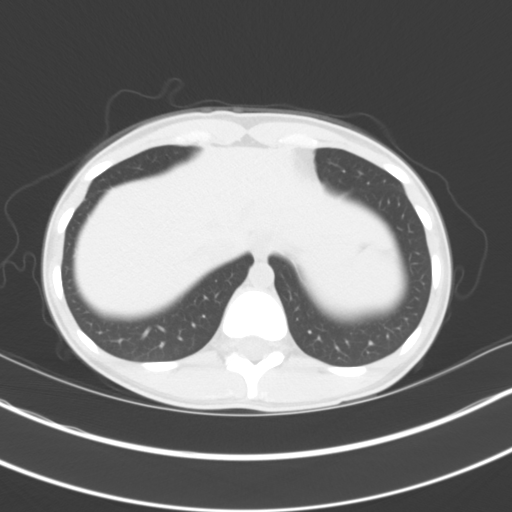
[im 84/92  lung]
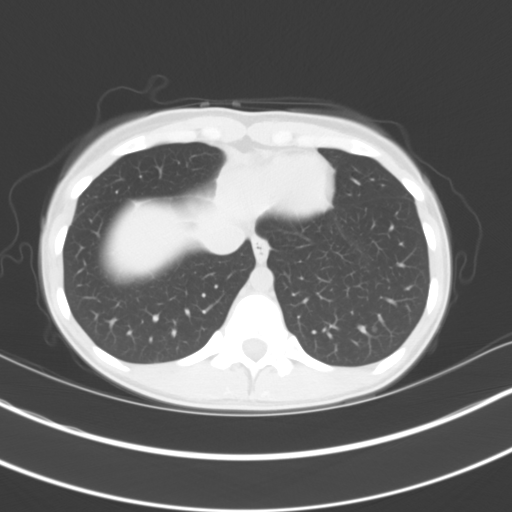
[im 88/92  soft-tissue]
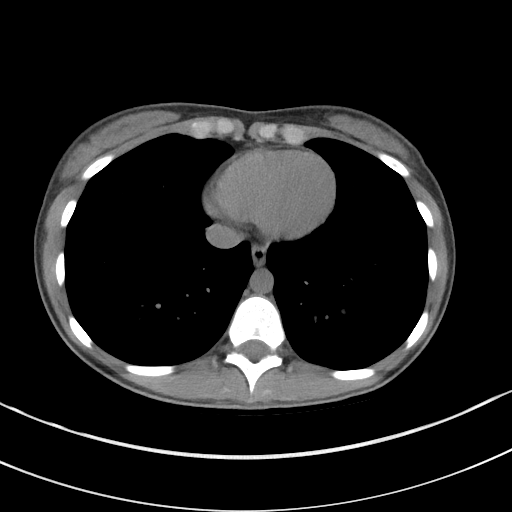
[im 88/92  lung]
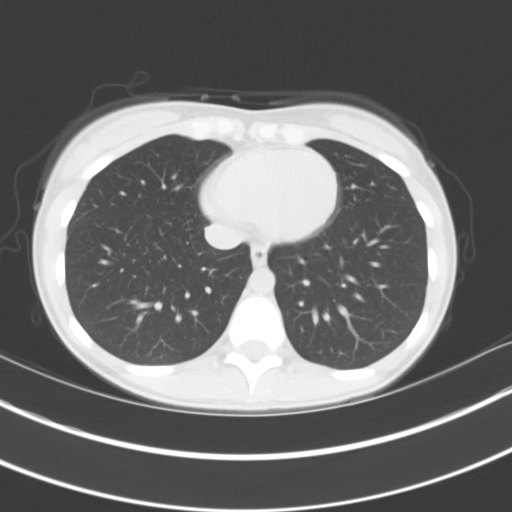

[15 of 32 positions shown; findings below may reference images not displayed]

FINDINGS: Lower chest: Included lung bases are clear.  Heart size is normal.

Hepatobiliary: Unremarkable unenhanced appearance of the liver. No
focal liver lesion identified. Gallbladder within normal limits. No
hyperdense gallstone. No biliary dilatation.

Pancreas: Unremarkable. No pancreatic ductal dilatation or
surrounding inflammatory changes.

Spleen: Normal in size without focal abnormality.

Adrenals/Urinary Tract: Adrenal glands are unremarkable. Kidneys are
normal, without renal calculi, focal lesion, or hydronephrosis. No
ureteral calculi identified. Urinary bladder appears unremarkable
for the degree of distension.

Stomach/Bowel: Evaluation of bowel is somewhat limited in the
absence of oral or IV contrast. Stomach is unremarkable. No dilated
loops of bowel. Noninflamed appendix containing appendicolith
(series 4, images 35-44). No focal bowel wall thickening or
inflammatory changes.

Vascular/Lymphatic: No significant vascular findings are present. No
enlarged abdominal or pelvic lymph nodes.

Reproductive: Uterus and bilateral adnexa are unremarkable.

Other: No free fluid. No abdominopelvic fluid collection. No
pneumoperitoneum. No abdominal wall hernia.

Musculoskeletal: No acute or significant osseous findings.
IMPRESSION: 1. No acute abdominopelvic findings. Specifically, no evidence of
nephrolithiasis or obstructive uropathy.
2. Noninflamed appendix containing appendicolith.

## 2022-03-28 NOTE — Patient Instructions (Incomplete)

## 2022-04-02 ENCOUNTER — Ambulatory Visit: Payer: Federal, State, Local not specified - PPO | Admitting: Nurse Practitioner

## 2022-04-16 ENCOUNTER — Encounter: Payer: Federal, State, Local not specified - PPO | Admitting: Obstetrics

## 2022-04-22 DIAGNOSIS — F330 Major depressive disorder, recurrent, mild: Secondary | ICD-10-CM | POA: Diagnosis not present

## 2022-04-22 DIAGNOSIS — F411 Generalized anxiety disorder: Secondary | ICD-10-CM | POA: Diagnosis not present

## 2022-05-07 ENCOUNTER — Ambulatory Visit: Payer: Federal, State, Local not specified - PPO | Admitting: Nurse Practitioner

## 2022-05-21 DIAGNOSIS — F411 Generalized anxiety disorder: Secondary | ICD-10-CM | POA: Diagnosis not present

## 2022-05-21 DIAGNOSIS — F330 Major depressive disorder, recurrent, mild: Secondary | ICD-10-CM | POA: Diagnosis not present

## 2022-05-28 ENCOUNTER — Encounter: Payer: Self-pay | Admitting: Nurse Practitioner

## 2022-05-31 MED ORDER — NORETHIN ACE-ETH ESTRAD-FE 1-20 MG-MCG PO TABS
1.0000 | ORAL_TABLET | Freq: Every day | ORAL | 2 refills | Status: DC
Start: 2022-05-31 — End: 2022-08-06

## 2022-06-09 ENCOUNTER — Ambulatory Visit
Admission: EM | Admit: 2022-06-09 | Discharge: 2022-06-09 | Disposition: A | Discharge status: Home / Self Care | Payer: Federal, State, Local not specified - PPO | Attending: Family Medicine | Admitting: Family Medicine

## 2022-06-09 DIAGNOSIS — N3001 Acute cystitis with hematuria: Secondary | ICD-10-CM | POA: Insufficient documentation

## 2022-06-09 DIAGNOSIS — R300 Dysuria: Secondary | ICD-10-CM | POA: Diagnosis not present

## 2022-06-09 LAB — URINALYSIS, MICROSCOPIC (REFLEX): RBC / HPF: >50 RBC/hpf (ref 0–5)

## 2022-06-09 LAB — URINALYSIS, ROUTINE W REFLEX MICROSCOPIC
Glucose, UA: NEGATIVE mg/dL
Ketones, ur: 15 mg/dL — AB
Nitrite: NEGATIVE
Protein, ur: >300 mg/dL — AB
Specific Gravity, Urine: 1.025 (ref 1.005–1.030)
pH: 6 (ref 5.0–8.0)

## 2022-06-09 MED ORDER — NITROFURANTOIN MONOHYD MACRO 100 MG PO CAPS
100.0000 mg | ORAL_CAPSULE | Freq: Two times a day (BID) | ORAL | 0 refills | Status: AC
Start: 2022-06-09 — End: 2022-06-14

## 2022-06-09 MED ORDER — PHENAZOPYRIDINE HCL 200 MG PO TABS: 200.0000 mg | ORAL_TABLET | Freq: Three times a day (TID) | ORAL | 0 refills | Status: DC | PRN

## 2022-06-09 NOTE — ED Triage Notes (Signed)
Pt c/o dysuria, odor to urine onset Monday, pt states she noticed some hematuria this morning

## 2022-06-09 NOTE — ED Provider Notes (Signed)
[facility]    CSN: [id number] Arrival date & time: 06/09/22  1032      History   Chief Complaint Chief Complaint  Patient presents with   Dysuria   Hematuria    HPI Brenda Rangel is a 21 y.o. female presenting for 2-day history of dysuria, urinary frequency and malodorous urine.  Patient also reports that she noticed blood in the urine this morning.  Denies being on her menstrual period, states that was 2 weeks ago.  Patient denies fever, fatigue, abdominal pain or flank pain.  Reports a history of frequent UTIs.  Has not had a UTI since last year.  HPI  Past Medical History:  Diagnosis Date   Abdominal migraine    Anxiety     Patient Active Problem List   Diagnosis Date Noted   Frequent UTI 05/26/2021   Multiple nevi 02/01/2020   Generalized anxiety disorder 11/16/2019   Uses birth control 08/28/2019    Past Surgical History:  Procedure Laterality Date   COLONOSCOPY WITH PROPOFOL N/A 09/17/2021   Procedure: COLONOSCOPY WITH PROPOFOL;  Surgeon: Virgel Manifold, MD;  Location: [facility] ENDOSCOPY;  Service: Endoscopy;  Laterality: N/A;   NO PAST SURGERIES      OB History   No obstetric history on file.      Home Medications    Prior to Admission medications   Medication Sig Start Date End Date Taking? Authorizing Provider  busPIRone (BUSPAR) 5 MG tablet Take 1 tablet (5 mg total) by mouth 2 (two) times daily. 05/08/21  Yes Cannady, Jolene T, NP  nitrofurantoin, macrocrystal-monohydrate, (MACROBID) 100 MG capsule Take 1 capsule (100 mg total) by mouth 2 (two) times daily for 5 days. 06/09/22 06/14/22 Yes Danton Clap, PA-C  norethindrone-ethinyl estradiol-FE (JUNEL FE 1/20) 1-20 MG-MCG tablet Take 1 tablet by mouth daily. 05/31/22  Yes Cannady, Jolene T, NP  phenazopyridine (PHENAZO) 200 MG tablet Take 1 tablet (200 mg total) by mouth 3 (three) times daily as needed for pain. 06/09/22  Yes Danton Clap, PA-C  venlafaxine XR (EFFEXOR XR) 75 MG 24  hr capsule Take 1 capsule (75 mg total) by mouth daily with breakfast. 02/19/22  Yes Cannady, Barbaraann Faster, NP    Family History Family History  Problem Relation Age of Onset   Anxiety disorder Mother    Depression Mother    Anxiety disorder Father    Depression Father    Pectus excavatum Sister    Breast cancer Maternal Grandmother    Pectus excavatum Adoptive Father     Social History Social History   Tobacco Use   Smoking status: Never   Smokeless tobacco: Never  Vaping Use   Vaping Use: Never used  Substance Use Topics   Alcohol use: Yes    Comment: soccially   Drug use: Never     Allergies   Patient has no known allergies.   Review of Systems Review of Systems  Constitutional:  Negative for chills, fatigue and fever.  Gastrointestinal:  Negative for abdominal pain, diarrhea, nausea and vomiting.  Genitourinary:  Positive for dysuria, frequency and urgency. Negative for decreased urine volume, flank pain, hematuria, pelvic pain, vaginal bleeding, vaginal discharge and vaginal pain.  Musculoskeletal:  Negative for back pain.  Skin:  Negative for rash.     Physical Exam Triage Vital Signs ED Triage Vitals  Enc Vitals Group     BP      Pulse      Resp  Temp      Temp src      SpO2      Weight      Height      Head Circumference      Peak Flow      Pain Score      Pain Loc      Pain Edu?      Excl. in [place]?    No data found.  Updated Vital Signs BP 118/79 (BP Location: Right Arm)   Pulse 76   Temp 98.6 F (37 C) (Oral)   Ht '5\' 3"'$  (1.6 m)   Wt 120 lb (54.4 kg)   LMP 05/24/2022 (Exact Date)   SpO2 98%   BMI 21.26 kg/m   Physical Exam Vitals and nursing note reviewed.  Constitutional:      General: She is not in acute distress.    Appearance: Normal appearance. She is not ill-appearing or toxic-appearing.  HENT:     Head: Normocephalic and atraumatic.  Eyes:     General: No scleral icterus.       Right eye: No discharge.        Left eye:  No discharge.     Conjunctiva/sclera: Conjunctivae normal.  Cardiovascular:     Rate and Rhythm: Normal rate and regular rhythm.     Heart sounds: Normal heart sounds.  Pulmonary:     Effort: Pulmonary effort is normal. No respiratory distress.     Breath sounds: Normal breath sounds.  Abdominal:     Palpations: Abdomen is soft.     Tenderness: There is no abdominal tenderness. There is no right CVA tenderness or left CVA tenderness.  Musculoskeletal:     Cervical back: Neck supple.  Skin:    General: Skin is dry.  Neurological:     General: No focal deficit present.     Mental Status: She is alert. Mental status is at baseline.     Motor: No weakness.     Gait: Gait normal.  Psychiatric:        Mood and Affect: Mood normal.        Behavior: Behavior normal.        Thought Content: Thought content normal.      UC Treatments / Results  Labs (all labs ordered are listed, but only abnormal results are displayed) Labs Reviewed  URINALYSIS, ROUTINE W REFLEX MICROSCOPIC - Abnormal; Notable for the following components:      Result Value   Color, Urine AMBER (*)    APPearance HAZY (*)    Hgb urine dipstick LARGE (*)    Bilirubin Urine SMALL (*)    Ketones, ur 15 (*)    Protein, ur >300 (*)    Leukocytes,Ua SMALL (*)    All other components within normal limits  URINALYSIS, MICROSCOPIC (REFLEX) - Abnormal; Notable for the following components:   Bacteria, UA MANY (*)    All other components within normal limits  URINE CULTURE    EKG   Radiology No results found.  Procedures Procedures (including critical care time)  Medications Ordered in UC Medications - No data to display  Initial Impression / Assessment and Plan / UC Course  I have reviewed the triage vital signs and the nursing notes.  Pertinent labs & imaging results that were available during my care of the patient were reviewed by me and considered in my medical decision making (see chart for  details).   21 year old female presenting for dysuria, frequency and malodorous  urine x2 days.  Hematuria x1 day.  No fever or red flag signs/symptoms.  Vitals are stable.  Abdomen is soft and nontender.  No CVA tenderness.  UA shows hazy urine with large hemoglobin, small bili, ketones, protein, small leukocytes and many bacteria.  We will send urine for culture.  Treating patient for acute UTI at this time with Macrobid.  Also sent Pyridium.  Encouraged increasing rest and fluids.  Reviewed return and ER precautions.   Final Clinical Impressions(s) / UC Diagnoses   Final diagnoses:  Acute cystitis with hematuria  Dysuria     Discharge Instructions      UTI: Based on either symptoms or urinalysis, you may have a urinary tract infection. We will send the urine for culture and call with results in a few days. Begin antibiotics at this time. Your symptoms should be much improved over the next 2-3 days. Increase rest and fluid intake. If for some reason symptoms are worsening or not improving after a couple of days or the urine culture determines the antibiotics you are taking will not treat the infection, the antibiotics may be changed. Return or go to ER for fever, back pain, worsening urinary pain, discharge, increased blood in urine. May take Tylenol or Motrin OTC for pain relief or consider AZO if no contraindications      ED Prescriptions     Medication Sig Dispense Auth. Provider   nitrofurantoin, macrocrystal-monohydrate, (MACROBID) 100 MG capsule Take 1 capsule (100 mg total) by mouth 2 (two) times daily for 5 days. 10 capsule Danton Clap, PA-C   phenazopyridine (PHENAZO) 200 MG tablet Take 1 tablet (200 mg total) by mouth 3 (three) times daily as needed for pain. 10 tablet Gretta Cool      PDMP not reviewed this encounter.   Danton Clap, PA-C 06/09/22 1127

## 2022-06-09 NOTE — Discharge Instructions (Signed)

## 2022-06-11 LAB — URINE CULTURE: Culture: 30000 — AB

## 2022-06-16 ENCOUNTER — Encounter: Payer: Federal, State, Local not specified - PPO | Admitting: Obstetrics

## 2022-06-17 ENCOUNTER — Ambulatory Visit: Payer: Federal, State, Local not specified - PPO | Admitting: Obstetrics

## 2022-06-17 ENCOUNTER — Encounter: Payer: Self-pay | Admitting: Obstetrics

## 2022-06-17 VITALS — BP 121/81 | HR 89 | Ht 63.0 in | Wt 121.0 lb

## 2022-06-17 DIAGNOSIS — Z8742 Personal history of other diseases of the female genital tract: Secondary | ICD-10-CM | POA: Diagnosis not present

## 2022-06-17 DIAGNOSIS — N390 Urinary tract infection, site not specified: Secondary | ICD-10-CM

## 2022-06-17 DIAGNOSIS — R6882 Decreased libido: Secondary | ICD-10-CM | POA: Diagnosis not present

## 2022-06-17 NOTE — Progress Notes (Signed)
GYN ENCOUNTER  Subjective  HPI: Brenda Rangel is a 21 y.o. G0P0000 who presents today to discuss issues concerning her history of ovarian cysts, recurrent UTI, low libido, future fertility r/t long-term OCP use, and abdominal pain. She declines a physical exam today because she prefers to discuss these concerns. She does plan to return for a Pap smear this year. She reports that she has had two ruptured ovarian cysts in the past, once in high school and once approximately a year ago. Both caused severe pain and were diagnosed post-rupture in the ED. She also reports frequent UTIs which happen multiple times each year. Sometimes they resolve spontaneously in 2 days or so, and sometimes they require antibiotic treatment. She has seen a urologist but was told the problem was anxiety. She also reports daily abdominal pain that occurs in the morning. She has a history of abdominal migraine for which she was treated in the past, but the medicated helped her pain but made her nauseated. She has had a colonoscopy and no abnormalities were found.  Brenda Rangel reports that her libido has always been fairly low, but she has been on OCPs since she was a teen and is not sure if that is related. Her partner has a significantly higher sex drive and it can sometimes be a source of concern.    Past Medical History:  Diagnosis Date   Abdominal migraine    Anxiety    Past Surgical History:  Procedure Laterality Date   COLONOSCOPY WITH PROPOFOL N/A 09/17/2021   Procedure: COLONOSCOPY WITH PROPOFOL;  Surgeon: Virgel Manifold, MD;  Location: [facility] END[facility]COPY;  Service: Endoscopy;  Laterality: N/A;   NO PAST SURGERIES     OB History     Gravida  0   Para  0   Term  0   Preterm  0   AB  0   Living  0      SAB  0   IAB  0   Ectopic  0   Multiple  0   Live Births  0          No Known Allergies  Negative except as noted in HPI History obtained from the patient  Objective  BP 121/81    Pulse 89   Ht '5\' 3"'$  (1.6 m)   Wt 121 lb (54.9 kg)   LMP 05/24/2022 (Approximate)   BMI 21.43 kg/m   General appearance: alert, cooperative, no distress  Assessment 1) H/o ovarian cysts 2) Recurrent UTI (>3 per year) 3) Low libido 4) Desires counseling regarding fertility 5) Chronic abdominal pain  Plan 1) Reviewed typical management when known cysts are present or symptoms appear. Encouraged to go to the ED if having severe pain. Already on OCPs. 2) Discussed options for daily prevention. She has already implemented hygiene recommendations. Encouraged cranberry tablets 500-'1000mg'$  daily, methenamine 1 gram daily, and D-mannose. Consider antibiotic prophylaxis if they continue. 3) Discussed normal variations in libido and when it becomes problematic. Encouraged working on lifestyle changes with her partner (they are frequently busy and she does not have time to unwind), prioritizing making changes to improve libido if it is important to her and her partner. 4) Educated on tracking cycle and recognizing signs of fertility. Reviewed resumption of fertility post-OCPs and that it may take several cycles for periods to0 normalize. Consider switching to IUD [place] is not interested in this option).  5) Discussed possible causes including functional pain/visceral hypersensitivity. Recommend scheduling appt with provider who diagnosed  her with abdominal migraines and seeking a treatment option with fewer negative side effects.  Return to care this year for annual visit and Pap smear.  Lloyd Huger, CNM

## 2022-07-02 ENCOUNTER — Other Ambulatory Visit (HOSPITAL_COMMUNITY)
Admission: RE | Admit: 2022-07-02 | Discharge: 2022-07-02 | Disposition: A | Discharge status: Home / Self Care | Payer: Federal, State, Local not specified - PPO | Source: Ambulatory Visit | Attending: Obstetrics | Admitting: Obstetrics

## 2022-07-02 ENCOUNTER — Ambulatory Visit (INDEPENDENT_AMBULATORY_CARE_PROVIDER_SITE_OTHER): Payer: Federal, State, Local not specified - PPO | Admitting: Obstetrics

## 2022-07-02 ENCOUNTER — Encounter: Payer: Self-pay | Admitting: Obstetrics

## 2022-07-02 VITALS — BP 122/81 | HR 90 | Ht 62.0 in | Wt 119.4 lb

## 2022-07-02 DIAGNOSIS — Z01419 Encounter for gynecological examination (general) (routine) without abnormal findings: Secondary | ICD-10-CM | POA: Diagnosis not present

## 2022-07-02 DIAGNOSIS — Z124 Encounter for screening for malignant neoplasm of cervix: Secondary | ICD-10-CM | POA: Insufficient documentation

## 2022-07-02 NOTE — Progress Notes (Signed)
SUBJECTIVE  HPI  Raymie Trani is a 21 y.o.-year-old [id number] who presents for an annual gynecological exam and Pap smear today. She denies unusual vaginal bleeding or discharge, pelvic pain, and dyspareunia. She does have frequent UTIs and has not yet started the prevention regimen we discussed at her last visit. She has questions today about PCOS and future fertility issues. She continues to struggle with anxiety and is being managed by her PCP and therapist.  Medical/Surgical History Past Medical History:  Diagnosis Date   Abdominal migraine    Anxiety    Past Surgical History:  Procedure Laterality Date   COLONOSCOPY WITH PROPOFOL N/A 09/17/2021   Procedure: COLONOSCOPY WITH PROPOFOL;  Surgeon: Virgel Manifold, MD;  Location: [facility] ENDOSCOPY;  Service: Endoscopy;  Laterality: N/A;   NO PAST SURGERIES      Social History Lives with boyfriend. Feels safe at home. Work: Art therapist Exercise: walking at work Substances: occasional EtOH. Denies tobacco, vape, and recreational drugs  Obstetric History OB History     Gravida  0   Para  0   Term  0   Preterm  0   AB  0   Living  0      SAB  0   IAB  0   Ectopic  0   Multiple  0   Live Births  0            GYN/Menstrual History Patient's last menstrual period was 06/18/2022 (exact date). regular periods every month Last Pap: has never had Contraception: OCPs  Prevention Endorses regular dental and eye exams Mammogram: at 40 Colonoscopy: at 45   Current Medications Outpatient Medications Prior to Visit  Medication Sig   busPIRone (BUSPAR) 5 MG tablet Take 1 tablet (5 mg total) by mouth 2 (two) times daily.   norethindrone-ethinyl estradiol-FE (JUNEL FE 1/20) 1-20 MG-MCG tablet Take 1 tablet by mouth daily.   phenazopyridine (PHENAZO) 200 MG tablet Take 1 tablet (200 mg total) by mouth 3 (three) times daily as needed for pain. (Patient not taking: Reported on 06/17/2022)   venlafaxine XR  (EFFEXOR XR) 75 MG 24 hr capsule Take 1 capsule (75 mg total) by mouth daily with breakfast. (Patient not taking: Reported on 06/17/2022)   No facility-administered medications prior to visit.      The pregnancy intention screening data noted above was reviewed. Potential methods of contraception were discussed. The patient elected to proceed with No data recorded.   ROS History obtained from the patient General ROS: negative for - chills, fatigue, fever, hot flashes, or night sweats Psychological ROS: positive for - anxiety Ophthalmic ROS: negative for - blurry vision or decreased vision ENT ROS: positive for - headaches negative for - sinus pain or sore throat Hematological and Lymphatic ROS: negative for - bleeding problems or swollen lymph nodes Endocrine ROS: positive for - polydipsia/polyuria negative for - hot flashes or palpitations Breast ROS: negative for breast lumps Respiratory ROS: no cough, shortness of breath, or wheezing Cardiovascular ROS: no chest pain or dyspnea on exertion Gastrointestinal ROS: no abdominal pain, change in bowel habits, or black or bloody stools Genito-Urinary ROS: no dysuria, trouble voiding, or hematuria Musculoskeletal ROS: negative Dermatological ROS: negative     02/19/2022   10:37 AM 12/18/2021    4:18 PM 08/13/2021    9:29 AM 07/31/2021    2:02 PM 06/19/2021    4:11 PM  Depression screen PHQ 2/9  Decreased Interest '3 3 2 3 '$ 0  Down, Depressed,  Hopeless '2 2 1 2 '$ 0  PHQ - 2 Score '5 5 3 5 '$ 0  Altered sleeping '1 1 3 2 1  '$ Tired, decreased energy '3 3 3 2 1  '$ Change in appetite '2 3 2 2 '$ 0  Feeling bad or failure about yourself  '1 1 1 2 '$ 0  Trouble concentrating '3 2 2 3 '$ 0  Moving slowly or fidgety/restless '2 2 2 2 '$ 0  Suicidal thoughts 0 0 0 0 0  PHQ-9 Score '17 17 16 18 2  '$ Difficult doing work/chores Very difficult Somewhat difficult Very difficult  Not difficult at all     OBJECTIVE  Last Weight  Most recent update: 07/02/2022  2:37 PM     Weight  54.2 kg (119 lb 6.4 oz)             Body mass index is 21.84 kg/m.    BP 122/81   Pulse 90   Ht '5\' 2"'$  (1.575 m)   Wt 119 lb 6.4 oz (54.2 kg)   LMP 06/18/2022 (Exact Date)   BMI 21.84 kg/m  General appearance: alert, cooperative, and appears stated age Head: Normocephalic, without obvious abnormality, atraumatic Eyes: negative findings: lids and lashes normal and conjunctivae and sclerae normal Neck: no adenopathy, supple, symmetrical, trachea midline, and thyroid not enlarged, symmetric, no tenderness/mass/nodules Lungs: clear to auscultation bilaterally Breasts: normal appearance, no masses or tenderness, Inspection negative, No nipple retraction or dimpling, No axillary or supraclavicular adenopathy, Normal to palpation without dominant masses, Taught monthly breast self examination Heart: regular rate and rhythm, S1, S2 normal, no murmur, click, rub or gallop Abdomen: soft, non-tender; bowel sounds normal; no masses,  no organomegaly Pelvic: cervix normal in appearance, external genitalia normal, no cervical motion tenderness, rectovaginal septum normal, vagina normal without discharge, and Pap collected Extremities: extremities normal, atraumatic, no cyanosis or edema Pulses: 2+ and symmetric Skin: Skin color, texture, turgor normal. No rashes or lesions Lymph nodes: Cervical, supraclavicular, and axillary nodes normal.  ASSESSMENT  1) Annual exam 2) Due for first Pap 3) Questions regarding PCOS/infertility  PLAN 1) Physical exam as noted. STI swabs collected with Pap. Labs: CBC, TSH, lipid profile, insulin, glucose, A1C. Discussed healthy lifestyle choices and preventive care. 2) Pap collected. F/u based on results. 3) Discussed testing for PCOS once Crystol is no longer on OCPs and potential implications for future fertility.  Return in one year for annual exam or as needed for concerns.   Lloyd Huger, CNM

## 2022-07-03 LAB — CBC
Hematocrit: 43.3 % (ref 34.0–46.6)
Hemoglobin: 14.3 g/dL (ref 11.1–15.9)
MCH: 29.4 pg (ref 26.6–33.0)
MCHC: 33 g/dL (ref 31.5–35.7)
MCV: 89 fL (ref 79–97)
Platelets: 224 10*3/uL (ref 150–450)
RBC: 4.86 x10E6/uL (ref 3.77–5.28)
RDW: 12.1 % (ref 11.7–15.4)
WBC: 8 10*3/uL (ref 3.4–10.8)

## 2022-07-03 LAB — TSH: TSH: 0.601 u[IU]/mL (ref 0.450–4.500)

## 2022-07-03 LAB — LIPID PANEL
Chol/HDL Ratio: 2.5 ratio (ref 0.0–4.4)
Cholesterol, Total: 144 mg/dL (ref 100–199)
HDL: 57 mg/dL (ref >39)
LDL Chol Calc (NIH): 72 mg/dL (ref 0–99)
Triglycerides: 78 mg/dL (ref 0–149)
VLDL Cholesterol Cal: 15 mg/dL (ref 5–40)

## 2022-07-03 LAB — GLUCOSE, RANDOM: Glucose: 64 mg/dL — ABNORMAL LOW (ref 70–99)

## 2022-07-03 LAB — HEMOGLOBIN A1C
Est. average glucose Bld gHb Est-mCnc: 100 mg/dL
Hgb A1c MFr Bld: 5.1 % (ref 4.8–5.6)

## 2022-07-03 LAB — INSULIN, RANDOM: INSULIN: 17.7 u[IU]/mL (ref 2.6–24.9)

## 2022-07-07 LAB — CYTOLOGY - PAP
Adequacy: ABSENT
Chlamydia: NEGATIVE
Comment: NEGATIVE
Comment: NEGATIVE
Comment: NORMAL
Diagnosis: NEGATIVE
Neisseria Gonorrhea: NEGATIVE
Trichomonas: NEGATIVE

## 2022-07-09 ENCOUNTER — Encounter: Payer: Self-pay | Admitting: Nurse Practitioner

## 2022-07-09 ENCOUNTER — Ambulatory Visit: Payer: Federal, State, Local not specified - PPO | Admitting: Nurse Practitioner

## 2022-07-09 VITALS — BP 112/75 | HR 99 | Temp 98.4°F | Ht 62.0 in | Wt 119.6 lb

## 2022-07-09 DIAGNOSIS — B356 Tinea cruris: Secondary | ICD-10-CM | POA: Diagnosis not present

## 2022-07-09 DIAGNOSIS — R21 Rash and other nonspecific skin eruption: Secondary | ICD-10-CM | POA: Diagnosis not present

## 2022-07-09 DIAGNOSIS — F411 Generalized anxiety disorder: Secondary | ICD-10-CM | POA: Diagnosis not present

## 2022-07-09 DIAGNOSIS — F330 Major depressive disorder, recurrent, mild: Secondary | ICD-10-CM | POA: Diagnosis not present

## 2022-07-09 MED ORDER — TRIAMCINOLONE ACETONIDE 0.1 % EX CREA: 1.0000 | TOPICAL_CREAM | Freq: Two times a day (BID) | CUTANEOUS | 4 refills | Status: DC

## 2022-07-09 MED ORDER — NYSTATIN 100000 UNIT/GM EX CREA: 1.0000 | TOPICAL_CREAM | Freq: Two times a day (BID) | CUTANEOUS | 1 refills | Status: DC

## 2022-07-09 NOTE — Assessment & Plan Note (Signed)
Under bilateral axilla -- Nystatin cream sent in for this and educated patient.

## 2022-07-09 NOTE — Progress Notes (Signed)
BP 112/75   Pulse 99   Temp 98.4 F (36.9 C) (Oral)   Ht '5\' 2"'$  (1.575 m)   Wt 119 lb 9.6 oz (54.3 kg)   LMP 06/18/2022 (Exact Date)   SpO2 98%   BMI 21.88 kg/m    Subjective:    Patient ID: Brenda Rangel, female    DOB: 01-30-2001, 21 y.o.   MRN: [id number]  HPI: Brenda Rangel is a 21 y.o. female  Chief Complaint  Patient presents with   Food Intolerance    Patient says on Sunday, she woke up with red bumps, all over her back and arms and towards the lower region. Patient says she could not think of anything new or change within her diet. Patient says she has no changed soaps or laundry detergent recently. Patient says she would like to see PCP first before Allergist. Patient says she has tried Benadryl.    RASH Woke-up Sunday morning with rash all over back and arms + lower region of body.  Is spreading under her arms and breasts.  Took Benadryl. Duration:  days  Location: generalized  Itching: no Burning: no Redness: no Oozing: no Scaling: no Blisters: no Painful: no Fevers: no Change in detergents/soaps/personal care products: no Recent illness: no Recent travel:no History of same: no Context: stable Alleviating factors: none Treatments attempted: Benadryl Shortness of breath: no  Throat/tongue swelling: no Myalgias/arthralgias: no   Relevant past medical, surgical, family and social history reviewed and updated as indicated. Interim medical history since our last visit reviewed. Allergies and medications reviewed and updated.  Review of Systems  Constitutional:  Negative for activity change, appetite change, diaphoresis, fatigue and fever.  Respiratory:  Negative for cough, chest tightness and shortness of breath.   Cardiovascular:  Negative for chest pain, palpitations and leg swelling.  Skin:  Positive for rash.  Psychiatric/Behavioral:  Negative for decreased concentration, self-injury, sleep disturbance and suicidal ideas. The patient is not  nervous/anxious.     Per HPI unless specifically indicated above     Objective:    BP 112/75   Pulse 99   Temp 98.4 F (36.9 C) (Oral)   Ht '5\' 2"'$  (1.575 m)   Wt 119 lb 9.6 oz (54.3 kg)   LMP 06/18/2022 (Exact Date)   SpO2 98%   BMI 21.88 kg/m   Wt Readings from Last 3 Encounters:  07/09/22 119 lb 9.6 oz (54.3 kg)  07/02/22 119 lb 6.4 oz (54.2 kg)  06/17/22 121 lb (54.9 kg)    Physical Exam Vitals and nursing note reviewed.  Constitutional:      General: She is awake. She is not in acute distress.    Appearance: She is well-developed and well-groomed. She is not ill-appearing.  HENT:     Head: Normocephalic.     Right Ear: Hearing normal.     Left Ear: Hearing normal.  Eyes:     General: Lids are normal.        Right eye: No discharge.        Left eye: No discharge.     Conjunctiva/sclera: Conjunctivae normal.     Pupils: Pupils are equal, round, and reactive to light.  Neck:     Thyroid: No thyromegaly.     Vascular: No carotid bruit.  Cardiovascular:     Rate and Rhythm: Normal rate and regular rhythm.     Heart sounds: Normal heart sounds. No murmur heard.    No gallop.  Pulmonary:     Effort:  Pulmonary effort is normal. No accessory muscle usage or respiratory distress.     Breath sounds: Normal breath sounds.  Abdominal:     General: Bowel sounds are normal.     Palpations: Abdomen is soft.  Musculoskeletal:     Cervical back: Normal range of motion and neck supple.     Right lower leg: No edema.     Left lower leg: No edema.  Skin:    General: Skin is warm and dry.     Findings: Rash present. Rash is urticarial.     Comments: Generalized urticarial rash all over body from neck to upper thighs with no blisters.  Under bilateral axilla a more tinea in presentation rash.  Neurological:     Mental Status: She is alert and oriented to person, place, and time.  Psychiatric:        Attention and Perception: Attention normal.        Mood and Affect: Mood  normal.        Speech: Speech normal.        Behavior: Behavior normal. Behavior is cooperative.        Thought Content: Thought content normal.     Results for orders placed or performed in visit on 07/02/22  CBC  Result Value Ref Range   WBC 8.0 3.4 - 10.8 x10E3/uL   RBC 4.86 3.77 - 5.28 x10E6/uL   Hemoglobin 14.3 11.1 - 15.9 g/dL   Hematocrit 43.3 34.0 - 46.6 %   MCV 89 79 - 97 fL   MCH 29.4 26.6 - 33.0 pg   MCHC 33.0 31.5 - 35.7 g/dL   RDW 12.1 11.7 - 15.4 %   Platelets 224 150 - 450 x10E3/uL  Insulin, random  Result Value Ref Range   INSULIN 17.7 2.6 - 24.9 uIU/mL  Glucose  Result Value Ref Range   Glucose 64 (L) 70 - 99 mg/dL  HgB A1c  Result Value Ref Range   Hgb A1c MFr Bld 5.1 4.8 - 5.6 %   Est. average glucose Bld gHb Est-mCnc 100 mg/dL  Lipid Profile  Result Value Ref Range   Cholesterol, Total 144 100 - 199 mg/dL   Triglycerides 78 0 - 149 mg/dL   HDL 57 >39 mg/dL   VLDL Cholesterol Cal 15 5 - 40 mg/dL   LDL Chol Calc (NIH) 72 0 - 99 mg/dL   Chol/HDL Ratio 2.5 0.0 - 4.4 ratio  TSH  Result Value Ref Range   TSH 0.601 0.450 - 4.500 uIU/mL  Cytology - PAP  Result Value Ref Range   Neisseria Gonorrhea Negative    Chlamydia Negative    Trichomonas Negative    Adequacy      Satisfactory for evaluation; transformation zone component ABSENT.   Diagnosis      - Negative for intraepithelial lesion or malignancy (NILM)   Comment Normal Reference Range Trichomonas - Negative    Comment Normal Reference Ranger Chlamydia - Negative    Comment      Normal Reference Range Neisseria Gonorrhea - Negative      Assessment & Plan:   Problem List Items Addressed This Visit       Musculoskeletal and Integument   Rash - Primary    Acute for 4 days -- allergic rash in appearance.  Will send in Triamcinolone cream and recommend she start Claritin 10 MG daily.  Check allergen labs today.        Relevant Orders   Allergen Profile, Shellfish  Allergen Profile, Basic  Food([facility])   Allergens(7)   Allergen Profile, Food-Milk   Tinea cruris    Under bilateral axilla -- Nystatin cream sent in for this and educated patient.      Relevant Medications   nystatin cream (MYCOSTATIN)     Follow up plan: Return in about 3 weeks (around 07/30/2022) for [place].

## 2022-07-09 NOTE — Patient Instructions (Signed)
Start taking Claritin 10 MG daily.    Hives Hives are itchy, red, swollen areas on your skin. Hives can show up on any part of your body. Hives often fade within 24 hours (acute hives). New hives can show up after old ones fade. This can go on for many days or weeks (chronic hives). Hives do not spread from person to person (are not contagious). Hives are caused by your body's response to something that you are allergic to (allergen). These are sometimes called triggers. You can get hives right after being around a trigger, or hours later. What are the causes? Allergies to foods. Insect bites or stings. Exposure to pollen or pets. Spending time in sunlight, heat, or cold. Exercise. Stress. You can also get hives from other medical conditions and treatments, such as: Some medicines. Chemicals or latex. Viruses. This includes the common cold. Infections caused by germs (bacteria). Allergy shots. Blood transfusions. Sometimes, the cause is not known. What increases the risk? Being a woman. Being allergic to foods such as: Citrus fruits. Milk. Eggs. Peanuts. Tree nuts. Shellfish. Being allergic to: Medicines. Latex. Insects. Animals. Pollen. What are the signs or symptoms?  Raised, itchy, red or white bumps or patches on your skin. These areas may: Get large and swollen. Change in shape and location. Stand alone or connect to each other over a large area of skin. Sting or hurt. Turn white when pressed in the center (blanch). In very bad cases, your hands, feet, and face may also get swollen. This may happen if hives start deeper in your skin. How is this treated? Treatment for this condition depends on your symptoms. Treatment may include: Using cool, wet cloths (cool compresses) or taking cool showers to stop the itching. Medicines that help: Relieve itching (antihistamines). Reduce swelling (corticosteroids). Treat infection (antibiotics). A medicine (omalizumab) that  is given as a shot (injection). Your doctor may prescribe this if you have hives that do not get better even after other treatments. In very bad cases, you may need a shot of a medicine called epinephrine to prevent a life-threatening allergic reaction (anaphylaxis). Follow these instructions at home: Medicines Take or apply over-the-counter and prescription medicines only as told by your doctor. If you were prescribed an antibiotic medicine, use it as told by your doctor. Do not stop using it even if you start to feel better. Skin care Apply cool, wet cloths to the hives. Do not scratch your skin. Do not rub your skin. General instructions Do not take hot showers or baths. This can make itching worse. Do not wear tight clothes. Use sunscreen and wear clothes that cover your skin when you are outside. Avoid any triggers that cause your hives. Keep a journal to help track what causes your hives. Write down: What medicines you take. What you eat and drink. What products you use on your skin. Keep all follow-up visits as told by your doctor. This is important. Contact a doctor if: Your symptoms are not better with medicine. Your joints hurt or are swollen. Get help right away if: You have a fever. You have pain in your belly (abdomen). Your tongue or lips are swollen. Your eyelids are swollen. Your chest or throat feels tight. You have trouble breathing or swallowing. These symptoms may be an emergency. Do not wait to see if the symptoms will go away. Get medical help right away. Call your local emergency services (911 in the [place] Do not drive yourself to the hospital. Summary Hives  are itchy, red, swollen areas on your skin. Treatment for this condition depends on your symptoms. Avoid things that cause your hives. Keep a journal to help track what causes your hives. Take and apply over-the-counter and prescription medicines only as told by your doctor. Get help right away if your  chest or throat feels tight or if you have trouble breathing or swallowing. This information is not intended to replace advice given to you by your health care provider. Make sure you discuss any questions you have with your health care provider. Document Revised: 11/21/2020 Document Reviewed: 11/23/2020 Elsevier Patient Education  [place].

## 2022-07-09 NOTE — Assessment & Plan Note (Signed)
Acute for 4 days -- allergic rash in appearance.  Will send in Triamcinolone cream and recommend she start Claritin 10 MG daily.  Check allergen labs today.

## 2022-07-11 ENCOUNTER — Encounter: Payer: Self-pay | Admitting: Nurse Practitioner

## 2022-07-11 LAB — ALLERGEN PROFILE, BASIC FOOD
Allergen Corn, IgE: <0.1 kU/L
Beef IgE: <0.1 kU/L
Chocolate/Cacao IgE: <0.1 kU/L
Egg, Whole IgE: <0.1 kU/L
Food Mix (Seafoods) IgE: NEGATIVE
Milk IgE: <0.1 kU/L
Peanut IgE: <0.1 kU/L
Pork IgE: <0.1 kU/L
Soybean IgE: <0.1 kU/L
Wheat IgE: <0.1 kU/L

## 2022-07-11 LAB — ALLERGEN PROFILE, SHELLFISH
Clam IgE: <0.1 kU/L
F023-IgE Crab: <0.1 kU/L
F080-IgE Lobster: <0.1 kU/L
F290-IgE Oyster: <0.1 kU/L
Scallop IgE: <0.1 kU/L
Shrimp IgE: 0.16 kU/L — AB

## 2022-07-11 LAB — ALLERGENS(7)
Brazil Nut IgE: <0.1 kU/L
F020-IgE Almond: <0.1 kU/L
F202-IgE Cashew Nut: <0.1 kU/L
Hazelnut (Filbert) IgE: <0.1 kU/L
Pecan Nut IgE: <0.1 kU/L
Walnut IgE: <0.1 kU/L

## 2022-07-11 LAB — ALLERGEN PROFILE, FOOD-MILK
F076-IgE Alpha Lactalbumin: <0.1 kU/L
F077-IgE Beta Lactoglobulin: <0.1 kU/L
F078-IgE Casein: <0.1 kU/L
F081-IgE Cheese, Cheddar Type: <0.1 kU/L
F082-IgE Cheese, Mold Type: <0.1 kU/L

## 2022-07-11 NOTE — Progress Notes (Signed)
Contacted via MyChart   Good afternoon Brenda Rangel, so far only item showing allergy on testing is shrimp.  Have you ate this recently or any bottom feeder type fish + anything with iodine? This could be cause of hives.  Would also benefit a visit with allergist.  I will place this referral and they will call you to schedule for a full allergy skin testing.  Any questions? Keep being awesome!!  Thank you for allowing me to participate in your care.  I appreciate you. Kindest regards, Jeremia Groot

## 2022-07-11 NOTE — Addendum Note (Signed)
Addended by: Marnee Guarneri T on: 07/11/2022 11:01 AM   Modules accepted: Orders

## 2022-07-15 ENCOUNTER — Encounter: Payer: Self-pay | Admitting: Obstetrics

## 2022-08-01 NOTE — Patient Instructions (Signed)

## 2022-08-06 ENCOUNTER — Ambulatory Visit: Payer: Federal, State, Local not specified - PPO | Admitting: Nurse Practitioner

## 2022-08-06 ENCOUNTER — Encounter: Payer: Self-pay | Admitting: Nurse Practitioner

## 2022-08-06 VITALS — BP 114/74 | HR 82 | Temp 98.6°F | Wt 123.2 lb

## 2022-08-06 DIAGNOSIS — F330 Major depressive disorder, recurrent, mild: Secondary | ICD-10-CM | POA: Diagnosis not present

## 2022-08-06 DIAGNOSIS — Z789 Other specified health status: Secondary | ICD-10-CM

## 2022-08-06 DIAGNOSIS — F411 Generalized anxiety disorder: Secondary | ICD-10-CM | POA: Diagnosis not present

## 2022-08-06 MED ORDER — NORETHIN ACE-ETH ESTRAD-FE 1-20 MG-MCG PO TABS
1.0000 | ORAL_TABLET | Freq: Every day | ORAL | 12 refills | Status: DC
Start: 2022-08-06 — End: 2023-08-19

## 2022-08-06 MED ORDER — VENLAFAXINE HCL ER 75 MG PO CP24: 75.0000 mg | ORAL_CAPSULE | Freq: Every day | ORAL | 4 refills | Status: DC

## 2022-08-06 MED ORDER — BUSPIRONE HCL 5 MG PO TABS: 5.0000 mg | ORAL_TABLET | Freq: Two times a day (BID) | ORAL | 4 refills | Status: DC

## 2022-08-06 NOTE — Progress Notes (Signed)
BP 114/74   Pulse 82   Temp 98.6 F (37 C) (Oral)   Wt 123 lb 3.2 oz (55.9 kg)   LMP 07/19/2022 (Exact Date)   SpO2 98%   BMI 22.53 kg/m    Subjective:    Patient ID: Brenda Rangel, female    DOB: [birth year], 21 y.o.   MRN: [id number]  HPI: Brenda Rangel is a 21 y.o. female  Chief Complaint  Patient presents with   Anxiety   Contraception   ANXIETY/STRESS Taking Effexor since March 2023 (tolerating well) and Buspar 5 MG BID.  Has tried Zoloft and Prozac in past with no benefit. She is attending counseling, [facility] -- sees her every two weeks.  She has been tested for ADD/ADHD in past, was diagnosed with ADD and general anxiety disorder.    Continues on birth control, oral contraceptive and tolerates well.  Has been on this since age 50 -- this has been best option for her.  Attended GYN for initial pap on 07/02/22 and recurrent UTI, they will take over prescribing over next year.   Duration: ongoing Anxious mood: yes Excessive worrying: yes Irritability: occasional Sweating: no Nausea: no Palpitations: none Hyperventilation: no Panic attacks: has not had any, but occasionally feels close to them Agoraphobia: no  Obscessions/compulsions: occasional Depressed mood: no    08/21/22    2:56 PM 07/09/2022   10:56 AM 02/19/2022   10:37 AM 12/18/2021    4:18 PM 08/13/2021    9:29 AM  Depression screen PHQ 2/9  Decreased Interest '2 1 3 3 2  '$ Down, Depressed, Hopeless '1 1 2 2 1  '$ PHQ - 2 Score '3 2 5 5 3  '$ Altered sleeping '1 2 1 1 3  '$ Tired, decreased energy '2 3 3 3 3  '$ Change in appetite '1 2 2 3 2  '$ Feeling bad or failure about yourself  0 0 '1 1 1  '$ Trouble concentrating '3 3 3 2 2  '$ Moving slowly or fidgety/restless '2 3 2 2 2  '$ Suicidal thoughts 0 0 0 0 0  PHQ-9 Score '12 15 17 17 16  '$ Difficult doing work/chores Somewhat difficult Somewhat difficult Very difficult Somewhat difficult Very difficult  Anhedonia: no Weight changes: no Insomnia: yes hard to stay  asleep  Hypersomnia: no Fatigue/loss of energy: no Feelings of worthlessness: no Feelings of guilt: no Impaired concentration/indecisiveness: yes Suicidal ideations: no  Crying spells: no Recent Stressors/Life Changes: yes   Relationship problems: no   Family stress: yes -- normal   Financial stress: no    Job stress: yes -- normal   Recent death/loss: no    2022/08/21    2:57 PM 07/09/2022   10:57 AM 02/19/2022   10:36 AM 12/18/2021    4:19 PM  GAD 7 : Generalized Anxiety Score  Nervous, Anxious, on Edge '2 2 3 1  '$ Control/stop worrying '2 1 3 1  '$ Worry too much - different things '2 2 3 3  '$ Trouble relaxing '3 2 3 3  '$ Restless '2 2 3 3  '$ Easily annoyed or irritable '2 1 3 3  '$ Afraid - awful might happen 1 0 1 0  Total GAD 7 Score '14 10 19 14  '$ Anxiety Difficulty Somewhat difficult Somewhat difficult Very difficult Somewhat difficult    Relevant past medical, surgical, family and social history reviewed and updated as indicated. Interim medical history since our last visit reviewed. Allergies and medications reviewed and updated.  Review of Systems  Constitutional:  Negative for activity  change, appetite change, diaphoresis, fatigue and fever.  Respiratory:  Negative for cough, chest tightness and shortness of breath.   Cardiovascular:  Negative for chest pain, palpitations and leg swelling.  Psychiatric/Behavioral:  Negative for decreased concentration, self-injury, sleep disturbance and suicidal ideas. The patient is nervous/anxious.    Per HPI unless specifically indicated above     Objective:    BP 114/74   Pulse 82   Temp 98.6 F (37 C) (Oral)   Wt 123 lb 3.2 oz (55.9 kg)   LMP 07/19/2022 (Exact Date)   SpO2 98%   BMI 22.53 kg/m   Wt Readings from Last 3 Encounters:  08/06/22 123 lb 3.2 oz (55.9 kg)  07/09/22 119 lb 9.6 oz (54.3 kg)  07/02/22 119 lb 6.4 oz (54.2 kg)    Physical Exam Vitals and nursing note reviewed.  Constitutional:      General: She is awake. She  is not in acute distress.    Appearance: She is well-developed and well-groomed. She is not ill-appearing.  HENT:     Head: Normocephalic.     Right Ear: Hearing normal.     Left Ear: Hearing normal.  Eyes:     General: Lids are normal.        Right eye: No discharge.        Left eye: No discharge.     Conjunctiva/sclera: Conjunctivae normal.     Pupils: Pupils are equal, round, and reactive to light.  Neck:     Thyroid: No thyromegaly.     Vascular: No carotid bruit.  Cardiovascular:     Rate and Rhythm: Normal rate and regular rhythm.     Heart sounds: Normal heart sounds. No murmur heard.    No gallop.  Pulmonary:     Effort: Pulmonary effort is normal. No accessory muscle usage or respiratory distress.     Breath sounds: Normal breath sounds.  Abdominal:     General: Bowel sounds are normal.     Palpations: Abdomen is soft.  Musculoskeletal:     Cervical back: Normal range of motion and neck supple.     Right lower leg: No edema.     Left lower leg: No edema.  Skin:    General: Skin is warm and dry.  Neurological:     Mental Status: She is alert and oriented to person, place, and time.  Psychiatric:        Attention and Perception: Attention normal.        Mood and Affect: Mood normal.        Speech: Speech normal.        Behavior: Behavior normal. Behavior is cooperative.        Thought Content: Thought content normal.    Results for orders placed or performed in visit on 07/09/22  Allergen Profile, Shellfish  Result Value Ref Range   Clam IgE <0.10 Class 0 kU/L   F023-IgE Crab <0.10 Class 0 kU/L   Shrimp IgE 0.16 (A) Class 0/I kU/L   Scallop IgE <0.10 Class 0 kU/L   F290-IgE Oyster <0.10 Class 0 kU/L   F080-IgE Lobster <0.10 Class 0 kU/L  Allergen Profile, Basic Food([facility])  Result Value Ref Range   Class Description Allergens Comment    Milk IgE <0.10 Class 0 kU/L   Wheat IgE <0.10 Class 0 kU/L   Allergen Corn, IgE <0.10 Class 0 kU/L   Peanut IgE <0.10  Class 0 kU/L   Soybean IgE <0.10 Class 0 kU/L  Pork IgE <0.10 Class 0 kU/L   Beef IgE <0.10 Class 0 kU/L   Food Mix (Seafoods) IgE Negative    Egg, Whole IgE <0.10 Class 0 kU/L   Chocolate/Cacao IgE <0.10 Class 0 kU/L  Allergens(7)  Result Value Ref Range   Hazelnut ([place]bert) IgE <0.10 Class 0 kU/L   [place] Nut IgE <0.10 Class 0 kU/L   F020-IgE Almond <0.10 Class 0 kU/L   Pecan Nut IgE <0.10 Class 0 kU/L   F202-IgE Cashew Nut <0.10 Class 0 kU/L   Walnut IgE <0.10 Class 0 kU/L  Allergen Profile, Food-Milk  Result Value Ref Range   F076-IgE Alpha Lactalbumin <0.10 Class 0 kU/L   F077-IgE Beta Lactoglobulin <0.10 Class 0 kU/L   F078-IgE Casein <0.10 Class 0 kU/L   F081-IgE Cheese, Cheddar Type <0.10 Class 0 kU/L   F082-IgE Cheese, Mold Type <0.10 Class 0 kU/L      Assessment & Plan:   Problem List Items Addressed This Visit       Other   Generalized anxiety disorder - Primary    Ongoing, stable.  Denies SI/HI.  Continue Effexor 75 MG daily and continue Buspar 5 MG BID adjust as needed (refills sent in as has been out of this), educated her on medications and possible side effects. Recommend meditation and relaxation exercises at home + continue therapy sessions.  Return in 6 months.      Relevant Medications   busPIRone (BUSPAR) 5 MG tablet   venlafaxine XR (EFFEXOR XR) 75 MG 24 hr capsule   Uses birth control    Ongoing, refills sent in.  Will plan on GYN taking over refills next year at her physical with them.        Follow up plan: Return in about 6 months (around 02/05/2023) for MOOD.

## 2022-08-06 NOTE — Assessment & Plan Note (Signed)
Ongoing, refills sent in.  Will plan on GYN taking over refills next year at her physical with them.

## 2022-08-06 NOTE — Assessment & Plan Note (Signed)
Ongoing, stable.  Denies SI/HI.  Continue Effexor 75 MG daily and continue Buspar 5 MG BID adjust as needed (refills sent in as has been out of this), educated her on medications and possible side effects. Recommend meditation and relaxation exercises at home + continue therapy sessions.  Return in 6 months.

## 2022-09-03 ENCOUNTER — Other Ambulatory Visit: Payer: Self-pay

## 2022-09-03 ENCOUNTER — Encounter: Payer: Self-pay | Admitting: Internal Medicine

## 2022-09-03 ENCOUNTER — Ambulatory Visit: Payer: Federal, State, Local not specified - PPO | Admitting: Internal Medicine

## 2022-09-03 VITALS — BP 120/62 | HR 88 | Temp 98.0°F | Resp 18 | Ht 63.0 in | Wt 122.1 lb

## 2022-09-03 DIAGNOSIS — L501 Idiopathic urticaria: Secondary | ICD-10-CM | POA: Diagnosis not present

## 2022-09-03 NOTE — Patient Instructions (Signed)
Dermatitis  Unclear from history and pictures whether this was a dermatitis or urticaria  Allergy testing today positive to tree pollen, dust mite and mixed feathers  No identified trigger based on exam and testing, most likely idiopathic  I am not concerned for a food allergy and further food testing is unnecessary   PLAN:  For any subsequent reactions write down everything you came in contact with 2 hours before symptoms onset  Start zyrtec (cetirizine) '10mg'$  daily (can increase to twice a day as needed) and continue until symptom free Start triamcinolone 0.1% ointment twice a day and continue until symptom free   Follow up in 6 months   Thank you so much for letting me partake in your care today.  Don't hesitate to reach out if you have any additional concerns!  Roney Marion, MD  [facility] [place] [place]  Reducing Pollen Exposure  The American Academy of Allergy, Asthma and Immunology suggests the following steps to reduce your exposure to pollen during allergy seasons.    Do not hang sheets or clothing out to dry; pollen may collect on these items. Do not mow lawns or spend time around freshly cut grass; mowing stirs up pollen. Keep windows closed at night.  Keep car windows closed while driving. Minimize morning activities outdoors, a time when pollen counts are usually at their highest. Stay indoors as much as possible when pollen counts or humidity is high and on windy days when pollen tends to remain in the air longer. Use air conditioning when possible.  Many air conditioners have filters that trap the pollen spores. Use a HEPA room air filter to remove pollen form the indoor air you breathe.  DUST MITE AVOIDANCE MEASURES:  There are three main measures that need and can be taken to avoid house dust mites:  Reduce accumulation of dust in general -reduce furniture, clothing, carpeting, books, stuffed animals, especially in bedroom  Separate yourself  from the dust -use pillow and mattress encasements (can be found at stores such as [employer], and [employer] or online) -avoid direct exposure to air condition flow -use a HEPA filter device, especially in the bedroom; you can also use a HEPA filter vacuum cleaner -wipe dust with a moist towel instead of a dry towel or broom when cleaning  Decrease mites and/or their secretions -wash clothing and linen and stuffed animals at highest temperature possible, at least every 2 weeks -stuffed animals can also be placed in a bag and put in a freezer overnight  Despite the above measures, it is impossible to eliminate dust mites or their allergen completely from your home.  With the above measures the burden of mites in your home can be diminished, with the goal of minimizing your allergic symptoms.  Success will be reached only when implementing and using all means together.

## 2022-09-03 NOTE — Progress Notes (Unsigned)
New Patient Note  RE: Brenda Rangel MRN: [id number] DOB: 07-Nov-2000 Date of Office Visit: 09/03/2022  Consult requested by: Venita Lick, NP Primary care provider: Venita Lick, NP  Chief Complaint: Rash (Broke out in rash in september2023 all over body)  History of Present Illness: I had the pleasure of seeing Brenda Rangel for initial evaluation at the [facility] [place] [facility] on 09/06/2022. She is a 21 y.o. female, who is referred here by Venita Lick, NP for the evaluation of rash .  History obtained from patient, chart review .  Symptoms started in September 17th.  Where she noted erythematous macules and papules on the back, chest.  She says symptoms were not particularly itchy.  Felt like symptoms progressively worsened over 3 days and then plateaued for a week before getting better.  She did not try any antihistamines.  She not tried any topical steroids.  Friends and family told her it looked like stress hives versus allergic reaction. She saw PCP who did sIgE testing to foods which was positive to shrimp at 0.16.  She can eat shrimp without any immediate symptoms and did not eat shrimp prior to onset of this rash.  Denies any association with her menstrual cycle.  Symptoms have not recurred.  Denies any preceding illnesses or vaccinations, new medications, new detergents, new foods or other exposures to attribute symptoms to.  She may have mild sneezing in the springtime but denies a significant rhinitis history.  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 Plan: Patient Instructions  Dermatitis  Unclear from history and pictures whether this was a dermatitis or urticaria  Allergy testing today positive to tree pollen, dust mite and mixed feathers  No identified trigger based on exam and testing, most likely idiopathic  I am not concerned for a food allergy and further food testing is unnecessary   PLAN:  For any  subsequent reactions write down everything you came in contact with 2 hours before symptoms onset  Start zyrtec (cetirizine) '10mg'$  daily (can increase to twice a day as needed) and continue until symptom free Start triamcinolone 0.1% ointment twice a day and continue until symptom free   Follow up in 6 months   Thank you so much for letting me partake in your care today.  Don't hesitate to reach out if you have any additional concerns!  Roney Marion, MD  [facility] [place] [place]  Reducing Pollen Exposure  The American Academy of Allergy, Asthma and Immunology suggests the following steps to reduce your exposure to pollen during allergy seasons.    Do not hang sheets or clothing out to dry; pollen may collect on these items. Do not mow lawns or spend time around freshly cut grass; mowing stirs up pollen. Keep windows closed at night.  Keep car windows closed while driving. Minimize morning activities outdoors, a time when pollen counts are usually at their highest. Stay indoors as much as possible when pollen counts or humidity is high and on windy days when pollen tends to remain in the air longer. Use air conditioning when possible.  Many air conditioners have filters that trap the pollen spores. Use a HEPA room air filter to remove pollen form the indoor air you breathe.  DUST MITE AVOIDANCE MEASURES:  There are three main measures that need and can be taken to avoid house dust mites:  Reduce accumulation of dust in general -reduce furniture, clothing, carpeting,  books, stuffed animals, especially in bedroom  Separate yourself from the dust -use pillow and mattress encasements (can be found at stores such as Bed, Bath, and [employer] or online) -avoid direct exposure to air condition flow -use a HEPA filter device, especially in the bedroom; you can also use a HEPA filter vacuum cleaner -wipe dust with a moist towel instead of a dry towel or broom when  cleaning  Decrease mites and/or their secretions -wash clothing and linen and stuffed animals at highest temperature possible, at least every 2 weeks -stuffed animals can also be placed in a bag and put in a freezer overnight  Despite the above measures, it is impossible to eliminate dust mites or their allergen completely from your home.  With the above measures the burden of mites in your home can be diminished, with the goal of minimizing your allergic symptoms.  Success will be reached only when implementing and using all means together.   No orders of the defined types were placed in this encounter.  Lab Orders  No laboratory test(s) ordered today    Other allergy screening: Asthma: no Rhino conjunctivitis: no Food allergy: no Medication allergy: no Hymenoptera allergy: no Urticaria: no Eczema:no History of recurrent infections suggestive of immunodeficency: no  Diagnostics:   Skin Testing: Environmental allergy panel. Allergy testing today positive to tree pollen, dust mite and mixed feathers  Results interpreted by myself and discussed with patient/family.  Airborne Adult Perc - 09/03/22 1537     Allergen Manufacturer Lavella Hammock    Location Back    Number of Test 59    Panel 1 Select    1. Control-Buffer 50% Glycerol Negative    2. Control-Histamine 1 mg/ml 4+    3. Albumin saline Negative    4. Keller Negative    5. [place] Negative    6. Johnson Negative    7. [place] Blue Negative    8. Meadow Fescue Negative    9. Perennial Rye Negative    10. Sweet Vernal Negative    11. Timothy Negative    12. Cocklebur Negative    13. Burweed Marshelder Negative    14. Ragweed, short Negative    15. Ragweed, Giant Negative    16. Plantain,  English Negative    17. Lamb's Quarters Negative    18. Sheep Sorrell Negative    19. Rough Pigweed Negative    20. Marsh Elder, Rough Negative    21. Mugwort, Common Negative    22. Ash mix Negative    23. Birch mix Negative     24. Beech American Negative    25. Box, Elder Negative    26. Cedar, red Negative    27. Cottonwood, [place] Negative    28. Elm mix Negative    29. Hickory 3+    30. Maple mix Negative    31. Oak, [place] mix Negative    32. Pecan Pollen Negative    33. Pine mix Negative    34. Sycamore Eastern Negative    35. [place], Black Pollen Negative    36. Alternaria alternata Negative    37. Cladosporium Herbarum Negative    38. Aspergillus mix Negative    39. Penicillium mix Negative    40. Bipolaris sorokiniana (Helminthosporium) Negative    41. Drechslera spicifera (Curvularia) Negative    42. Mucor plumbeus Negative    43. Fusarium moniliforme Negative    44. Aureobasidium pullulans (pullulara) Negative    45. Rhizopus oryzae Negative  46. Botrytis cinera Negative    47. Epicoccum nigrum Negative    48. Phoma betae Negative    49. Candida Albicans Negative    50. Trichophyton mentagrophytes Negative    51. Mite, D Farinae  5,000 AU/ml Negative    52. Mite, D Pteronyssinus  5,000 AU/ml 3+    53. Cat Hair 10,000 BAU/ml Negative    54.  Dog Epithelia Negative    55. Mixed Feathers 3+    56. Horse Epithelia Negative    57. Cockroach, German Negative    58. Mouse Negative    59. 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   Generalized anxiety disorder 11/16/2019   Uses birth control 08/28/2019   Past Medical History:  Diagnosis Date   Abdominal migraine    Anxiety    Past Surgical History: Past Surgical History:  Procedure Laterality Date   ADENOIDECTOMY     COLONOSCOPY WITH PROPOFOL N/A 09/17/2021   Procedure: COLONOSCOPY WITH PROPOFOL;  Surgeon: Virgel Manifold, MD;  Location: [facility] ENDOSCOPY;  Service: Endoscopy;  Laterality: N/A;   NO PAST SURGERIES     TYMPANOSTOMY TUBE PLACEMENT     Medication List:   Current Outpatient Medications  Medication Sig Dispense Refill   busPIRone (BUSPAR) 5 MG tablet Take 1 tablet (5 mg total) by mouth 2 (two) times daily. 180 tablet 4   norethindrone-ethinyl estradiol-FE (JUNEL FE 1/20) 1-20 MG-MCG tablet Take 1 tablet by mouth daily. 28 tablet 12   venlafaxine XR (EFFEXOR XR) 75 MG 24 hr capsule Take 1 capsule (75 mg total) by mouth daily with breakfast. 90 capsule 4   triamcinolone cream (KENALOG) 0.1 % Apply 1 Application topically 2 (two) times daily. 30 g 4   No current facility-administered medications for this visit.   Allergies: No Known Allergies Social History: Social History   Socioeconomic History   Marital status: Single    Spouse name: Not on file   Number of children: Not on file   Years of education: Not on file   Highest education level: Not on file  Occupational History    Comment: [employer] --- associates in science  Tobacco Use   Smoking status: Never   Smokeless tobacco: Never  Vaping Use   Vaping Use: Never used  Substance and Sexual Activity   Alcohol use: Yes    Comment: soccially   Drug use: Never   Sexual activity: Yes    Birth control/protection: Pill    Comment: monogamous  Other Topics Concern   Not on file  Social History Narrative   Not on file   Social Determinants of Health   Financial Resource Strain: Low Risk  (08/28/2019)   Overall Financial Resource Strain (CARDIA)    Difficulty of Paying Living Expenses: Not hard at all  Food Insecurity: No Food Insecurity (08/28/2019)   Hunger Vital Sign    Worried About Running Out of Food in the Last Year: Never true    Ran Out of Food in the Last Year: Never true  Transportation Needs: No Transportation Needs (08/28/2019)   PRAPARE - Hydrologist (Medical): No    Lack of Transportation (Non-Medical): No  Physical Activity: Insufficiently Active (08/28/2019)   Exercise Vital Sign    Days of Exercise per Week:  3 days    Minutes of  Exercise per Session: 30 min  Stress: No Stress Concern Present (08/28/2019)   [place]    Feeling of Stress : Only a little  Social Connections: Unknown (08/28/2019)   Social Connection and Isolation Panel [NHANES]    Frequency of Communication with Friends and Family: Three times a week    Frequency of Social Gatherings with Friends and Family: Three times a week    Attends Religious Services: Never    Active Member of Clubs or Organizations: No    Attends Archivist Meetings: Never    Marital Status: Not on file   Lives in a mobile home.  That is over 27 years old.  There are no roaches in the house and bed is 2 feet off the floor.  There are no dust mite precautions on bed or pillows.  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  There is no HEPA filter in the home and [place] an interstate or [place] area. Smoking: No exposure Occupation: Works as a Insurance underwriter History: Environmental education officer in the house:  Lawyer in the family room: no Carpet in the bedroom: no Heating: heat pump Cooling: heat pump Pet: yes not inside the home, cats and dogs outside the home  Family History: Family History  Adopted: Yes  Problem Relation Age of Onset   Anxiety disorder Mother    Depression Mother    Anxiety disorder Father    Depression Father    Allergic rhinitis Sister    Pectus excavatum Sister    Breast cancer Maternal Grandmother    Pectus excavatum Adoptive Father      ROS: All others negative except as noted per HPI.   Objective: BP 120/62   Pulse 88   Temp 98 F (36.7 C)   Resp 18   Ht '5\' 3"'$  (1.6 m)   Wt 122 lb 1.6 oz (55.4 kg)   LMP 07/19/2022 (Exact Date)   SpO2 99%   BMI 21.63 kg/m  Body mass index is 21.63 kg/m.  General Appearance:  Alert, cooperative, no distress, appears stated age  Head:  Normocephalic, without  obvious abnormality, atraumatic  Eyes:  Conjunctiva clear, EOM's intact  Nose: Nares normal, normal mucosa, no visible anterior polyps, and septum midline  Throat: Lips, tongue normal; teeth and gums normal, normal posterior oropharynx and no tonsillar exudate  Neck: Supple, symmetrical  Lungs:   clear to auscultation bilaterally, Respirations unlabored, no coughing  Heart:  regular rate and rhythm and no murmur, Appears well perfused  Extremities: No edema  Skin: Skin color, texture, turgor normal, no rashes or lesions on visualized portions of skin  Neurologic: No gross deficits   The plan was reviewed with the patient/family, and all questions/concerned were addressed.  It was my pleasure to see Brenda Rangel today and participate in her care. Please feel free to contact me with any questions or concerns.  Sincerely,  Roney Marion, MD Allergy & Immunology  [facility] office: [phone] [facility] office: [phone]

## 2022-11-12 DIAGNOSIS — F330 Major depressive disorder, recurrent, mild: Secondary | ICD-10-CM | POA: Diagnosis not present

## 2022-11-12 DIAGNOSIS — F411 Generalized anxiety disorder: Secondary | ICD-10-CM | POA: Diagnosis not present

## 2022-12-03 DIAGNOSIS — F411 Generalized anxiety disorder: Secondary | ICD-10-CM | POA: Diagnosis not present

## 2022-12-03 DIAGNOSIS — F330 Major depressive disorder, recurrent, mild: Secondary | ICD-10-CM | POA: Diagnosis not present

## 2022-12-14 ENCOUNTER — Ambulatory Visit: Payer: Self-pay

## 2022-12-14 NOTE — Telephone Encounter (Signed)
Patient made aware of Provider's recommendations and verbalized understanding.

## 2022-12-14 NOTE — Telephone Encounter (Signed)
Message from Alm Bustard sent at 12/14/2022  1:39 PM EST  Pt states that she missed her menstrual cycle and she is on birth control. She took a pregnancy test and it was negative and is thinking it might me a cyst. Pt is wanting to know if she should make an appt with her PCP or if she need to call her OBGYN.  Please advise.   Chief Complaint: missed period x 7 dats overdue Symptoms: bloating, lower abd cramping Frequency: 7 days  Pertinent Negatives: Patient denies positive pregnancy test x 2, fever, vomiting, vaginal discharge Disposition: '[]'$ ED /'[]'$ Urgent Care (no appt availability in office) / '[]'$ Appointment(In office/virtual)/ '[]'$  [facility]/ '[]'$ Home Care/ '[]'$ Refused Recommended Disposition /'[]'$ [facility]/ '[x]'$  Follow-up with PCP Additional Notes: recommended to call GYN office then if unable to get seen in a timely manner to call back and make appt with PCP. Pt verbalized understanding.  Reason for Disposition  Menstrual period, missed or late  Answer Assessment - Initial Assessment Questions 1. LMP:  "When did your last menstrual period begin?"     Last week Jan 2. DAYS LATE: "How many days late is your menstrual period?"     7 days 3. REGULARITY: "How regular are your periods?"     28  4. PREGNANCY: "Is there any chance you are pregnant?" (e.g., unprotected intercourse, missed birth control pill, broken condom) "Have you used a home pregnancy test?"     Neg x 2 - 5. BREASTFEEDING: "Are you breastfeeding?"     N/a 6. BIRTH CONTROL PILLS: "Are you taking birth control pills, or have you stopped recently?"     BCP - no 7. LONG-ACTING CONTRACEPTION: "Has your doctor given you a shot to prevent pregnancy?" (e.g., Depo-Provera injection) "Do you have an intrauterine device (IUD)".     no 8. CAUSE: "What do you think caused the missed period?" (e.g., stress, rapid weight loss, excessive exercise)     Stress related, 2 ovarian cysts  and 2 ruptured 9. OTHER  SYMPTOMS: "Do you have any other symptoms?" (e.g., abdomen pain)     Bloating and took pics, if comes after eating doesn't go away for hours, lower abdomen pelvic area, cramp comes and goes x 1 day,  Protocols used: Menstrual Period - Missed or Late-A-AH

## 2022-12-15 ENCOUNTER — Telehealth: Payer: Self-pay

## 2022-12-15 NOTE — Telephone Encounter (Signed)
Pt calling; was sent to triage line by scheduler; missed a period from Feb 19-23rd; is on bcp and has never missed a period; she chalked it up to stress until she read that it could be a sign of ovarian cyst which she has a hx of two of which ruptured; other sxs include pressure below the belly button and bloating.  Should she be checked out to be sure an ovarian cyst isn't growing?  [phone]

## 2022-12-15 NOTE — Telephone Encounter (Signed)
Spoke to [place] on the phone. Brenda [place] reports that Brenda [place] missed a period on OCPs although her periods are typically very regular, and Brenda [place] has been on OCPs x 6 years. Brenda [place] denies pain but reports abdominal bloating, pressure, and loose stools. Brenda [place] has had 2 negative pregnancy tests. Brenda [place] has a h/o 2 ovarian cysts that presented with no symptoms until they ruptured. Advised that there are no symptoms requiring ED visit at this time, but if Brenda [place] begins to have severe pain to seek care. If symptoms worsen or do not improve, consider outpatient imaging. Brenda [place] verbalizes agreement with this plan.  Lurlean Horns, CNM

## 2023-01-29 NOTE — Patient Instructions (Signed)
Managing Anxiety, Adult After being diagnosed with anxiety, you may be relieved to know why you have felt or behaved a certain way. You may also feel overwhelmed about the treatment ahead and what it will mean for your life. With care and support, you can manage your anxiety. How to manage lifestyle changes Understanding the difference between stress and anxiety Although stress can play a role in anxiety, it is not the same as anxiety. Stress is your body's reaction to life changes and events, both good and bad. Stress is often caused by something external, such as a deadline, test, or competition. It normally goes away after the event has ended and will last just a few hours. But, stress can be ongoing and can lead to more than just stress. Anxiety is caused by something internal, such as imagining a terrible outcome or worrying that something will go wrong that will greatly upset you. Anxiety often does not go away even after the event is over, and it can become a long-term (chronic) worry. Lowering stress and anxiety Talk with your health care provider or a counselor to learn more about lowering anxiety and stress. They may suggest tension-reduction techniques, such as: Music. Spend time creating or listening to music that you enjoy and that inspires you. Mindfulness-based meditation. Practice being aware of your normal breaths while not trying to control your breathing. It can be done while sitting or walking. Centering prayer. Focus on a word, phrase, or sacred image that means something to you and brings you peace. Deep breathing. Expand your stomach and inhale slowly through your nose. Hold your breath for 3-5 seconds. Then breathe out slowly, letting your stomach muscles relax. Self-talk. Learn to notice and spot thought patterns that lead to anxiety reactions. Change those patterns to thoughts that feel peaceful. Muscle relaxation. Take time to tense muscles and then relax them. Choose a  tension-reduction technique that fits your lifestyle and personality. These techniques take time and practice. Set aside 5-15 minutes a day to do them. Specialized therapists can offer counseling and training in these techniques. The training to help with anxiety may be covered by some insurance plans. Other things you can do to manage stress and anxiety include: Keeping a stress diary. This can help you learn what triggers your reaction and then learn ways to manage your response. Thinking about how you react to certain situations. You may not be able to control everything, but you can control your response. Making time for activities that help you relax and not feeling guilty about spending your time in this way. Doing visual imagery. This involves imagining or creating mental pictures to help you relax. Practicing yoga. Through yoga poses, you can lower tension and relax.  Medicines Medicines for anxiety include: Antidepressant medicines. These are usually prescribed for long-term daily control. Anti-anxiety medicines. These may be added in severe cases, especially when panic attacks occur. When used together, medicines, psychotherapy, and tension-reduction techniques may be the most effective treatment. Relationships Relationships can play a big part in helping you recover. Spend more time connecting with trusted friends and family members. Think about going to couples counseling if you have a partner, taking family education classes, or going to family therapy. Therapy can help you and others better understand your anxiety. How to recognize changes in your anxiety Everyone responds differently to treatment for anxiety. Recovery from anxiety happens when symptoms lessen and stop interfering with your daily life at home or work. This may mean that you   will start to: Have better concentration and focus. Worry will interfere less in your daily thinking. Sleep better. Be less irritable. Have more  energy. Have improved memory. Try to recognize when your condition is getting worse. Contact your provider if your symptoms interfere with home or work and you feel like your condition is not improving. Follow these instructions at home: Activity Exercise. Adults should: Exercise for at least 150 minutes each week. The exercise should increase your heart rate and make you sweat (moderate-intensity exercise). Do strengthening exercises at least twice a week. Get the right amount and quality of sleep. Most adults need 7-9 hours of sleep each night. Lifestyle  Eat a healthy diet that includes plenty of vegetables, fruits, whole grains, low-fat dairy products, and lean protein. Do not eat a lot of foods that are high in fats, added sugars, or salt (sodium). Make choices that simplify your life. Do not use any products that contain nicotine or tobacco. These products include cigarettes, chewing tobacco, and vaping devices, such as e-cigarettes. If you need help quitting, ask your provider. Avoid caffeine, alcohol, and certain over-the-counter cold medicines. These may make you feel worse. Ask your pharmacist which medicines to avoid. General instructions Take over-the-counter and prescription medicines only as told by your provider. Keep all follow-up visits. This is to make sure you are managing your anxiety well or if you need more support. Where to find support You can get help and support from: Self-help groups. Online and community organizations. A trusted spiritual leader. Couples counseling. Family education classes. Family therapy. Where to find more information You may find that joining a support group helps you deal with your anxiety. The following sources can help you find counselors or support groups near you: Mental Health America: mentalhealthamerica.net Anxiety and Depression Association of America (ADAA): adaa.org National Alliance on Mental Illness (NAMI): nami.org Contact  a health care provider if: You have a hard time staying focused or finishing tasks. You spend many hours a day feeling worried about everyday life. You are very tired because you cannot stop worrying. You start to have headaches or often feel tense. You have chronic nausea or diarrhea. Get help right away if: Your heart feels like it is racing. You have shortness of breath. You have thoughts of hurting yourself or others. Get help right away if you feel like you may hurt yourself or others, or have thoughts about taking your own life. Go to your nearest emergency room or: Call 911. Call the National Suicide Prevention Lifeline at 1-800-273-8255 or 988. This is open 24 hours a day. Text the Crisis Text Line at 741741. This information is not intended to replace advice given to you by your health care provider. Make sure you discuss any questions you have with your health care provider. Document Revised: 07/13/2022 Document Reviewed: 01/25/2021 Elsevier Patient Education  2023 Elsevier Inc.  

## 2023-02-04 ENCOUNTER — Encounter: Payer: Self-pay | Admitting: Nurse Practitioner

## 2023-02-04 ENCOUNTER — Ambulatory Visit: Payer: Federal, State, Local not specified - PPO | Admitting: Nurse Practitioner

## 2023-02-04 VITALS — BP 109/75 | HR 65 | Temp 97.8°F | Ht 62.99 in | Wt 121.1 lb

## 2023-02-04 DIAGNOSIS — F411 Generalized anxiety disorder: Secondary | ICD-10-CM

## 2023-02-04 DIAGNOSIS — D229 Melanocytic nevi, unspecified: Secondary | ICD-10-CM | POA: Diagnosis not present

## 2023-02-04 DIAGNOSIS — W57XXXA Bitten or stung by nonvenomous insect and other nonvenomous arthropods, initial encounter: Secondary | ICD-10-CM | POA: Diagnosis not present

## 2023-02-04 DIAGNOSIS — S80861A Insect bite (nonvenomous), right lower leg, initial encounter: Secondary | ICD-10-CM | POA: Diagnosis not present

## 2023-02-04 MED ORDER — BUSPIRONE HCL 10 MG PO TABS: 10.0000 mg | ORAL_TABLET | Freq: Two times a day (BID) | ORAL | 4 refills | Status: DC
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
## 2023-02-04 NOTE — Assessment & Plan Note (Signed)
Ongoing, stable.  Denies SI/HI.  Continue Effexor 75 MG daily and increase Buspar to 10 MG BID adjust further as needed, educated her on medications and possible side effects. Recommend meditation and relaxation exercises at home + continue therapy sessions.  Return in 6 months.

## 2023-02-04 NOTE — Assessment & Plan Note (Signed)
Small, < 2 cm, to back and anterior abdomen + anterior left thigh.  Educated her on these and will continue to monitor.  If any changes will send to dermatology for further assessment or biopsy in office.

## 2023-02-04 NOTE — Assessment & Plan Note (Signed)
Took place 11 days ago with no current symptoms.  Overall tick bite healed.  Recommend we monitor and if symptoms present then alert provider.  Discussed this tick season will be very active and to monitor skin closely.

## 2023-08-01 ENCOUNTER — Ambulatory Visit
Admission: EM | Admit: 2023-08-01 | Discharge: 2023-08-01 | Payer: Federal, State, Local not specified - PPO | Attending: Family Medicine | Admitting: Family Medicine

## 2023-08-01 DIAGNOSIS — Z8742 Personal history of other diseases of the female genital tract: Secondary | ICD-10-CM | POA: Diagnosis not present

## 2023-08-01 DIAGNOSIS — R1031 Right lower quadrant pain: Secondary | ICD-10-CM | POA: Diagnosis not present

## 2023-08-01 LAB — URINALYSIS, ROUTINE W REFLEX MICROSCOPIC
Bilirubin Urine: NEGATIVE
Glucose, UA: NEGATIVE mg/dL
Hgb urine dipstick: NEGATIVE
Ketones, ur: NEGATIVE mg/dL
Leukocytes,Ua: NEGATIVE
Nitrite: NEGATIVE
Protein, ur: NEGATIVE mg/dL
Specific Gravity, Urine: 1.025 (ref 1.005–1.030)
pH: 7 (ref 5.0–8.0)

## 2023-08-01 LAB — PREGNANCY, URINE: Preg Test, Ur: NEGATIVE

## 2023-08-01 NOTE — ED Provider Notes (Signed)
[facility]    CSN: [id number] Arrival date & time: 08/01/23  0825      History   Chief Complaint Chief Complaint  Patient presents with   Abdominal Pain    HPI Brenda Rangel is a 22 y.o. female.   HPI  Sherrie presents for crampy right lower quadrant abdominal pain that occurred shortly after waking up this morning.  She states she is unable to bend down due to the pain.  Reports history of ruptured cyst.  She had a normal nonbloody bowel movement this morning.  States that she has at least 1 bowel movement a day.    She called her gynecologist who recommended she go to the emergency department but she came to the urgent care instead.  Reports no diarrhea, fever, vomiting or nausea. Patient's last menstrual period was 07/14/2023 (approximate).        Past Medical History:  Diagnosis Date   Abdominal migraine    Anxiety     Patient Active Problem List   Diagnosis Date Noted   Tick bite 02/04/2023   Tinea cruris 07/09/2022   Frequent UTI 05/26/2021   Multiple nevi 02/01/2020   Generalized anxiety disorder 11/16/2019   Uses birth control 08/28/2019    Past Surgical History:  Procedure Laterality Date   ADENOIDECTOMY     COLONOSCOPY WITH PROPOFOL N/A 09/17/2021   Procedure: COLONOSCOPY WITH PROPOFOL;  Surgeon: Pasty Spillers, MD;  Location: [facility]COPY;  Service: Endoscopy;  Laterality: N/A;   NO PAST SURGERIES     TYMPANOSTOMY TUBE PLACEMENT      OB History     Gravida  0   Para  0   Term  0   Preterm  0   AB  0   Living  0      SAB  0   IAB  0   Ectopic  0   Multiple  0   Live Births  0            Home Medications    Prior to Admission medications   Medication Sig Start Date End Date Taking? Authorizing Provider  norethindrone-ethinyl estradiol-FE (JUNEL FE 1/20) 1-20 MG-MCG tablet Take 1 tablet by mouth daily. 08/06/22  Yes Cannady, Jolene T, NP  busPIRone (BUSPAR) 10 MG tablet Take 1 tablet (10 mg  total) by mouth 2 (two) times daily. 02/04/23   Cannady, Corrie Dandy T, NP  triamcinolone cream (KENALOG) 0.1 % Apply 1 Application topically 2 (two) times daily. 07/09/22   Aura Dials T, NP  venlafaxine XR (EFFEXOR XR) 75 MG 24 hr capsule Take 1 capsule (75 mg total) by mouth daily with breakfast. 08/06/22   Marjie Skiff, NP    Family History Family History  Adopted: Yes  Problem Relation Age of Onset   Anxiety disorder Mother    Depression Mother    Anxiety disorder Father    Depression Father    Allergic rhinitis Sister    Pectus excavatum Sister    Breast cancer Maternal Grandmother    Pectus excavatum Adoptive Father     Social History Social History   Tobacco Use   Smoking status: Never   Smokeless tobacco: Never  Vaping Use   Vaping status: Never Used  Substance Use Topics   Alcohol use: Yes    Comment: soccially   Drug use: Never     Allergies   Patient has no known allergies.   Review of Systems Review of Systems :negative unless otherwise  stated in HPI.      Physical Exam Triage Vital Signs ED Triage Vitals [08/01/23 0839]  Encounter Vitals Group     BP 111/88     Systolic BP Percentile      Diastolic BP Percentile      Pulse Rate (!) 110     Resp 17     Temp 98.5 F (36.9 C)     Temp Source Oral     SpO2 100 %     Weight      Height      Head Circumference      Peak Flow      Pain Score 5     Pain Loc      Pain Education      Exclude from Growth Chart    No data found.  Updated Vital Signs BP 111/88 (BP Location: Left Arm)   Pulse (!) 110   Temp 98.5 F (36.9 C) (Oral)   Resp 17   LMP 07/14/2023 (Approximate)   SpO2 100%   Visual Acuity Right Eye Distance:   Left Eye Distance:   Bilateral Distance:    Right Eye Near:   Left Eye Near:    Bilateral Near:     Physical Exam  GEN: well appearing female, in no acute distress  CV: regular rate and rhythm RESP: no increased work of breathing, clear to ascultation  bilaterally ABD: Bowel sounds present. Soft, right lower quadrant and suprapubic tenderness, non-distended.  No guarding, no rebound, no appreciable hepatosplenomegaly, no CVA tenderness, equivocal McBurney's, negative Murphy MSK: no extremity edema SKIN: warm, dry, no rash on visible skin NEURO: alert, moves all extremities appropriately   UC Treatments / Results  Labs (all labs ordered are listed, but only abnormal results are displayed) Labs Reviewed  URINALYSIS, ROUTINE W REFLEX MICROSCOPIC  PREGNANCY, URINE    EKG  If EKG performed, see my interpretation and MDM section  Radiology No results found.   Procedures Procedures (including critical care time)  Medications Ordered in UC Medications - No data to display  Initial Impression / Assessment and Plan / UC Course  I have reviewed the triage vital signs and the nursing notes.  Pertinent labs & imaging results that were available during my care of the patient were reviewed by me and considered in my medical decision making (see chart for details).       Patient is a  22 y.o. femalewith history ovarian cyst and frequent UTIs who presents after having insidious severe RLQ abdominal pain after waking this morning.  Overall, patient is well-appearing, well-hydrated, and in no acute distress.  She is afebrile.  She is normotensive. Patient initially tachycardic but this resolved.  On chart review, pelvic ultrasound from April 2023 showed no pelvic abnormalities.  CT abdomen pelvis from 06/04/2021 showed no pelvic abnormalities.  CT abdomen pelvis from September 12, 2018 2019 showed likely ruptured ovarian cyst off of the left ovary measuring 1.5 x 1.5 cm.  Differential diagnosis includes: Appendicitis, ovarian cyst, UTI, STI, ovarian torsion, kidney stones, constipation.   Brenda Rangel is afebrile.  On exam, she has right lower quadrant abdominal tenderness and suprapubic tenderness. Obtained UA and urine pregnancy which were both  unremarkable.  Recommended ED evaluation for advanced imaging to rule out appendicitis, nephrolithiasis and/or ovarian cyst.  Patient will travel to [facility].     Follow-up, return and ED precautions given.  Discussed MDM, treatment plan and plan for follow-up with patient who agrees  with plan.    Final Clinical Impressions(s) / UC Diagnoses   Final diagnoses:  Right lower quadrant abdominal pain  History of ovarian cyst     Discharge Instructions      You do not have evidence of a UTI nor are you pregnant.  Given that you are having right lower quadrant abdominal pain I cannot rule out a appendicitis here.  You have been advised to follow up immediately in the emergency department for concerning signs or symptoms as discussed during your visit. If you declined EMS transport, please have a family member take you directly to the ED at this time. Do not delay.   Based on concerns about condition, if you do not follow up in the ED, you may risk poor outcomes including worsening of condition, delayed treatment and potentially life threatening issues. If you have declined to go to the ED at this time, you should call your PCP immediately to set up a follow up appointment.      ED Prescriptions   None    PDMP not reviewed this encounter.   Katha Cabal, DO 08/01/23 1034

## 2023-08-01 NOTE — Discharge Instructions (Addendum)
You do not have evidence of a UTI nor are you pregnant.  Given that you are having right lower quadrant abdominal pain I cannot rule out a appendicitis here.  You have been advised to follow up immediately in the emergency department for concerning signs or symptoms as discussed during your visit. If you declined EMS transport, please have a family member take you directly to the ED at this time. Do not delay.   Based on concerns about condition, if you do not follow up in the ED, you may risk poor outcomes including worsening of condition, delayed treatment and potentially life threatening issues. If you have declined to go to the ED at this time, you should call your PCP immediately to set up a follow up appointment.

## 2023-08-01 NOTE — ED Triage Notes (Signed)
Patient states that she woke up this morning and turned and felt a pain in her right lower abdomin. Sharp pain. Had BM. Normal.  Pain is dull and pressure. Hx of ovarian cyst.

## 2023-08-12 ENCOUNTER — Encounter: Payer: Federal, State, Local not specified - PPO | Admitting: Nurse Practitioner

## 2023-08-18 ENCOUNTER — Encounter: Payer: Self-pay | Admitting: Nurse Practitioner

## 2023-08-19 MED ORDER — NORETHIN ACE-ETH ESTRAD-FE 1-20 MG-MCG PO TABS: 1.0000 | ORAL_TABLET | Freq: Every day | ORAL | 4 refills | Status: DC

## 2023-08-19 NOTE — Telephone Encounter (Signed)
Attempted to reach pt, LVM to schedule appointment for a physical.  Put in CRM.

## 2023-09-17 NOTE — Patient Instructions (Signed)

## 2023-09-23 ENCOUNTER — Encounter: Payer: Self-pay | Admitting: Nurse Practitioner

## 2023-09-23 ENCOUNTER — Ambulatory Visit (INDEPENDENT_AMBULATORY_CARE_PROVIDER_SITE_OTHER): Payer: Federal, State, Local not specified - PPO | Admitting: Nurse Practitioner

## 2023-09-23 VITALS — BP 117/83 | HR 92 | Temp 98.2°F | Ht 63.0 in | Wt 120.4 lb

## 2023-09-23 DIAGNOSIS — Z118 Encounter for screening for other infectious and parasitic diseases: Secondary | ICD-10-CM

## 2023-09-23 DIAGNOSIS — Z0000 Encounter for general adult medical examination without abnormal findings: Secondary | ICD-10-CM | POA: Diagnosis not present

## 2023-09-23 DIAGNOSIS — F411 Generalized anxiety disorder: Secondary | ICD-10-CM | POA: Diagnosis not present

## 2023-09-23 DIAGNOSIS — Z136 Encounter for screening for cardiovascular disorders: Secondary | ICD-10-CM | POA: Diagnosis not present

## 2023-09-23 DIAGNOSIS — Z13220 Encounter for screening for lipoid disorders: Secondary | ICD-10-CM | POA: Diagnosis not present

## 2023-09-23 DIAGNOSIS — Z789 Other specified health status: Secondary | ICD-10-CM

## 2023-09-23 MED ORDER — NORETHIN ACE-ETH ESTRAD-FE 1-20 MG-MCG PO TABS: 1.0000 | ORAL_TABLET | Freq: Every day | ORAL | 12 refills | Status: DC

## 2023-09-23 MED ORDER — VENLAFAXINE HCL ER 75 MG PO CP24: 75.0000 mg | ORAL_CAPSULE | Freq: Every day | ORAL | 4 refills | Status: DC

## 2023-09-23 NOTE — Assessment & Plan Note (Signed)
Ongoing, with some increased stressors at present.  Denies SI/HI.  She wishes to continue Effexor 75 MG daily and Buspar 10 MG BID, will adjust further as needed, educated her on medications and possible side effects. Recommend meditation and relaxation exercises at home + continue therapy sessions.  Discussed with her if needed could add on low dose Propranolol to assist with anxiety and tremors, could take on PRN basis.  Educated her on this medication and side effects.  Her HR at baseline is in 90 range.  Refills sent.

## 2023-09-23 NOTE — Progress Notes (Signed)
BP 117/83 (BP Location: Left Arm, Patient Position: Sitting, Cuff Size: Normal)   Pulse 92   Temp 98.2 F (36.8 C) (Oral)   Ht 5\' 3"  (1.6 m)   Wt 120 lb 6.4 oz (54.6 kg)   SpO2 99%   BMI 21.33 kg/m    Subjective:    Patient ID: Brenda Rangel, female    DOB: April 19, 2001, 22 y.o.   MRN: [id number]  HPI: Brenda Rangel is a 22 y.o. female presenting on 09/23/2023 for comprehensive medical examination. Current medical complaints include:none  She currently lives with: family Menopausal Symptoms: no  DEPRESSION Got engaged recently which is stressor.  Main stressor is work. Continues on Effexor and Buspar.  Does get tremors at times to hands.  Mood status: exacerbated Satisfied with current treatment?: yes Symptom severity: moderate  Duration of current treatment : chronic Side effects: no Medication compliance: good compliance Psychotherapy/counseling: yes current Depressed mood: occasional Anxious mood:  occasional Anhedonia: no Significant weight loss or gain: no Insomnia: none Fatigue: yes Feelings of worthlessness or guilt: no Impaired concentration/indecisiveness: yes Suicidal ideations: no Hopelessness: no Crying spells: yes    09/23/2023    4:09 PM 02/04/2023   10:44 AM 08/06/2022    2:56 PM 07/09/2022   10:56 AM 02/19/2022   10:37 AM  Depression screen PHQ 2/9  Decreased Interest 2 1 2 1 3   Down, Depressed, Hopeless 2 1 1 1 2   PHQ - 2 Score 4 2 3 2 5   Altered sleeping 3 3 1 2 1   Tired, decreased energy 3 2 2 3 3   Change in appetite 0 2 1 2 2   Feeling bad or failure about yourself  1 0 0 0 1  Trouble concentrating 3 1 3 3 3   Moving slowly or fidgety/restless 2 1 2 3 2   Suicidal thoughts 0 0 0 0 0  PHQ-9 Score 16 11 12 15 17   Difficult doing work/chores  Somewhat difficult Somewhat difficult Somewhat difficult Very difficult      09/23/2023    4:09 PM 02/04/2023   10:44 AM 08/06/2022    2:57 PM 07/09/2022   10:57 AM  GAD 7 : Generalized Anxiety Score   Nervous, Anxious, on Edge 3 2 2 2   Control/stop worrying 1 2 2 1   Worry too much - different things 3 2 2 2   Trouble relaxing 3 2 3 2   Restless 2 2 2 2   Easily annoyed or irritable 3 2 2 1   Afraid - awful might happen 0 0 1 0  Total GAD 7 Score 15 12 14 10   Anxiety Difficulty  Somewhat difficult Somewhat difficult Somewhat difficult    Past Medical History:  Past Medical History:  Diagnosis Date   Abdominal migraine    Anxiety     Surgical History:  Past Surgical History:  Procedure Laterality Date   ADENOIDECTOMY     COLONOSCOPY WITH PROPOFOL N/A 09/17/2021   Procedure: COLONOSCOPY WITH PROPOFOL;  Surgeon: Pasty Spillers, MD;  Location: [facility];  Service: Endoscopy;  Laterality: N/A;   NO PAST SURGERIES     TYMPANOSTOMY TUBE PLACEMENT      Medications:  Current Outpatient Medications on File Prior to Visit  Medication Sig   busPIRone (BUSPAR) 10 MG tablet Take 1 tablet (10 mg total) by mouth 2 (two) times daily.   No current facility-administered medications on file prior to visit.    Allergies:  Allergies  Allergen Reactions   Hazel Tree Pollen [Corylus] Rash  Other Rash    Shrimp, dust mites, mixed feathers     Social History:  Social History   Socioeconomic History   Marital status: Single    Spouse name: Not on file   Number of children: Not on file   Years of education: Not on file   Highest education level: Not on file  Occupational History    Comment: [employer] --- associates in science  Tobacco Use   Smoking status: Never   Smokeless tobacco: Never  Vaping Use   Vaping status: Never Used  Substance and Sexual Activity   Alcohol use: Yes    Comment: soccially   Drug use: Never   Sexual activity: Yes    Birth control/protection: Pill    Comment: monogamous  Other Topics Concern   Not on file  Social History Narrative   Not on file   Social Determinants of Health   Financial Resource Strain: Low Risk  (08/28/2019)   Overall  Financial Resource Strain (CARDIA)    Difficulty of Paying Living Expenses: Not hard at all  Food Insecurity: No Food Insecurity (08/28/2019)   Hunger Vital Sign    Worried About Running Out of Food in the Last Year: Never true    Ran Out of Food in the Last Year: Never true  Transportation Needs: No Transportation Needs (08/28/2019)   PRAPARE - Administrator, Civil Service (Medical): No    Lack of Transportation (Non-Medical): No  Physical Activity: Insufficiently Active (08/28/2019)   Exercise Vital Sign    Days of Exercise per Week: 3 days    Minutes of Exercise per Session: 30 min  Stress: No Stress Concern Present (08/28/2019)   [employer] of Occupational Health - Occupational Stress Questionnaire    Feeling of Stress : Only a little  Social Connections: Unknown (08/28/2019)   Social Connection and Isolation Panel [NHANES]    Frequency of Communication with Friends and Family: Three times a week    Frequency of Social Gatherings with Friends and Family: Three times a week    Attends Religious Services: Never    Active Member of Clubs or Organizations: No    Attends Banker Meetings: Never    Marital Status: Not on file  Intimate Partner Violence: Not At Risk (08/28/2019)   Humiliation, Afraid, Rape, and Kick questionnaire    Fear of Current or Ex-Partner: No    Emotionally Abused: No    Physically Abused: No    Sexually Abused: No   Social History   Tobacco Use  Smoking Status Never  Smokeless Tobacco Never   Social History   Substance and Sexual Activity  Alcohol Use Yes   Comment: soccially    Family History:  Family History  Adopted: Yes  Problem Relation Age of Onset   Anxiety disorder Mother    Depression Mother    Anxiety disorder Father    Depression Father    Allergic rhinitis Sister    Pectus excavatum Sister    Breast cancer Maternal Grandmother    Pectus excavatum Adoptive Father     Past medical history,  surgical history, medications, allergies, family history and social history reviewed with patient today and changes made to appropriate areas of the chart.   ROS All other ROS negative except what is listed above and in the HPI.      Objective:    BP 117/83 (BP Location: Left Arm, Patient Position: Sitting, Cuff Size: Normal)   Pulse 92  Temp 98.2 F (36.8 C) (Oral)   Ht 5\' 3"  (1.6 m)   Wt 120 lb 6.4 oz (54.6 kg)   SpO2 99%   BMI 21.33 kg/m   Wt Readings from Last 3 Encounters:  09/23/23 120 lb 6.4 oz (54.6 kg)  02/04/23 121 lb 1.6 oz (54.9 kg)  09/03/22 122 lb 1.6 oz (55.4 kg)    Physical Exam Vitals and nursing note reviewed.  Constitutional:      General: She is awake. She is not in acute distress.    Appearance: She is well-developed and well-groomed. She is not ill-appearing or toxic-appearing.  HENT:     Head: Normocephalic and atraumatic.     Right Ear: Hearing, tympanic membrane, ear canal and external ear normal. No drainage.     Left Ear: Hearing, tympanic membrane, ear canal and external ear normal. No drainage.     Nose: Nose normal.     Right Sinus: No maxillary sinus tenderness or frontal sinus tenderness.     Left Sinus: No maxillary sinus tenderness or frontal sinus tenderness.     Mouth/Throat:     Mouth: Mucous membranes are moist.     Pharynx: Oropharynx is clear. Uvula midline. No pharyngeal swelling, oropharyngeal exudate or posterior oropharyngeal erythema.  Eyes:     General: Lids are normal.        Right eye: No discharge.        Left eye: No discharge.     Extraocular Movements: Extraocular movements intact.     Conjunctiva/sclera: Conjunctivae normal.     Pupils: Pupils are equal, round, and reactive to light.     Visual Fields: Right eye visual fields normal and left eye visual fields normal.  Neck:     Thyroid: No thyromegaly.     Vascular: No carotid bruit.     Trachea: Trachea normal.  Cardiovascular:     Rate and Rhythm: Normal rate  and regular rhythm.     Heart sounds: Normal heart sounds. No murmur heard.    No gallop.  Pulmonary:     Effort: Pulmonary effort is normal. No accessory muscle usage or respiratory distress.     Breath sounds: Normal breath sounds.  Abdominal:     General: Bowel sounds are normal.     Palpations: Abdomen is soft. There is no hepatomegaly or splenomegaly.     Tenderness: There is no abdominal tenderness.  Musculoskeletal:        General: Normal range of motion.     Cervical back: Normal range of motion and neck supple.     Right lower leg: No edema.     Left lower leg: No edema.  Lymphadenopathy:     Head:     Right side of head: No submental, submandibular, tonsillar, preauricular or posterior auricular adenopathy.     Left side of head: No submental, submandibular, tonsillar, preauricular or posterior auricular adenopathy.     Cervical: No cervical adenopathy.  Skin:    General: Skin is warm and dry.     Capillary Refill: Capillary refill takes less than 2 seconds.     Findings: No rash.  Neurological:     Mental Status: She is alert and oriented to person, place, and time.     Gait: Gait is intact.     Deep Tendon Reflexes: Reflexes are normal and symmetric.     Reflex Scores:      Brachioradialis reflexes are 2+ on the right side and 2+ on the left  side.      Patellar reflexes are 2+ on the right side and 2+ on the left side. Psychiatric:        Attention and Perception: Attention normal.        Mood and Affect: Mood normal.        Speech: Speech normal.        Behavior: Behavior normal. Behavior is cooperative.        Thought Content: Thought content normal.        Judgment: Judgment normal.    Results for orders placed or performed during the hospital encounter of 08/01/23  Urinalysis, Routine w reflex microscopic -Urine, Clean Catch  Result Value Ref Range   Color, Urine YELLOW YELLOW   APPearance CLEAR CLEAR   Specific Gravity, Urine 1.025 1.005 - 1.030   pH 7.0  5.0 - 8.0   Glucose, UA NEGATIVE NEGATIVE mg/dL   Hgb urine dipstick NEGATIVE NEGATIVE   Bilirubin Urine NEGATIVE NEGATIVE   Ketones, ur NEGATIVE NEGATIVE mg/dL   Protein, ur NEGATIVE NEGATIVE mg/dL   Nitrite NEGATIVE NEGATIVE   Leukocytes,Ua NEGATIVE NEGATIVE  Pregnancy, urine  Result Value Ref Range   Preg Test, Ur NEGATIVE NEGATIVE      Assessment & Plan:   Problem List Items Addressed This Visit       Other   Generalized anxiety disorder - Primary    Ongoing, with some increased stressors at present.  Denies SI/HI.  She wishes to continue Effexor 75 MG daily and Buspar 10 MG BID, will adjust further as needed, educated her on medications and possible side effects. Recommend meditation and relaxation exercises at home + continue therapy sessions.  Discussed with her if needed could add on low dose Propranolol to assist with anxiety and tremors, could take on PRN basis.  Educated her on this medication and side effects.  Her HR at baseline is in 90 range.  Refills sent.      Relevant Medications   venlafaxine XR (EFFEXOR XR) 75 MG 24 hr capsule   Other Relevant Orders   TSH   Uses birth control    Ongoing, refills sent in.  Labs obtained today.  Continue to follow with GYN annually.      Other Visit Diagnoses     Encounter for lipid screening for cardiovascular disease       Lipid panel today   Relevant Orders   Comprehensive metabolic panel   Lipid Panel w/o Chol/HDL Ratio   Screening for chlamydial disease       Urine testing obtained today, discussed with patient.   Relevant Orders   GC/Chlamydia Probe Amp   Encounter for annual physical exam       Annual physical today with labs and health maintenance reviewed, discussed with patient.   Relevant Orders   CBC with Differential/Platelet        Follow up plan: Return in about 6 months (around 03/23/2024) for ANXIETY.   LABORATORY TESTING:  - Pap smear: up to date  IMMUNIZATIONS:   - Tdap: Tetanus  vaccination status reviewed: last tetanus booster within 10 years. - Influenza: Refused - Pneumovax: Not applicable - Prevnar: Not applicable - COVID: Refused - HPV: Up to date - Shingrix vaccine: Not applicable  SCREENING: -Mammogram: Not applicable  - Colonoscopy: Not applicable  - Bone Density: Not applicable  -Hearing Test: Not applicable  -Spirometry: Not applicable   PATIENT COUNSELING:   Advised to take 1 mg of folate supplement per day if capable  of pregnancy.   Sexuality: Discussed sexually transmitted diseases, partner selection, use of condoms, avoidance of unintended pregnancy  and contraceptive alternatives.   Advised to avoid cigarette smoking.  I discussed with the patient that most people either abstain from alcohol or drink within safe limits (<=14/week and <=4 drinks/occasion for males, <=7/weeks and <= 3 drinks/occasion for females) and that the risk for alcohol disorders and other health effects rises proportionally with the number of drinks per week and how often a drinker exceeds daily limits.  Discussed cessation/primary prevention of drug use and availability of treatment for abuse.   Diet: Encouraged to adjust caloric intake to maintain  or achieve ideal body weight, to reduce intake of dietary saturated fat and total fat, to limit sodium intake by avoiding high sodium foods and not adding table salt, and to maintain adequate dietary potassium and calcium preferably from fresh fruits, vegetables, and low-fat dairy products.    Stressed the importance of regular exercise  Injury prevention: Discussed safety belts, safety helmets, smoke detector, smoking near bedding or upholstery.   Dental health: Discussed importance of regular tooth brushing, flossing, and dental visits.    NEXT PREVENTATIVE PHYSICAL DUE IN 1 YEAR. Return in about 6 months (around 03/23/2024) for ANXIETY.

## 2023-09-23 NOTE — Assessment & Plan Note (Signed)
Ongoing, refills sent in.  Labs obtained today.  Continue to follow with GYN annually.

## 2023-09-24 ENCOUNTER — Encounter: Payer: Self-pay | Admitting: Nurse Practitioner

## 2023-09-24 LAB — CBC WITH DIFFERENTIAL/PLATELET
Basophils Absolute: 0.1 10*3/uL (ref 0.0–0.2)
Basos: 1 %
EOS (ABSOLUTE): 0.1 10*3/uL (ref 0.0–0.4)
Eos: 1 %
Hematocrit: 39.7 % (ref 34.0–46.6)
Hemoglobin: 13.4 g/dL (ref 11.1–15.9)
Immature Grans (Abs): 0 10*3/uL (ref 0.0–0.1)
Immature Granulocytes: 0 %
Lymphocytes Absolute: 2 10*3/uL (ref 0.7–3.1)
Lymphs: 24 %
MCH: 30.4 pg (ref 26.6–33.0)
MCHC: 33.8 g/dL (ref 31.5–35.7)
MCV: 90 fL (ref 79–97)
Monocytes Absolute: 0.6 10*3/uL (ref 0.1–0.9)
Monocytes: 7 %
Neutrophils Absolute: 5.5 10*3/uL (ref 1.4–7.0)
Neutrophils: 67 %
Platelets: 228 10*3/uL (ref 150–450)
RBC: 4.41 x10E6/uL (ref 3.77–5.28)
RDW: 11.8 % (ref 11.7–15.4)
WBC: 8.3 10*3/uL (ref 3.4–10.8)

## 2023-09-24 LAB — LIPID PANEL W/O CHOL/HDL RATIO
Cholesterol, Total: 149 mg/dL (ref 100–199)
HDL: 60 mg/dL (ref >39)
LDL Chol Calc (NIH): 74 mg/dL (ref 0–99)
Triglycerides: 80 mg/dL (ref 0–149)
VLDL Cholesterol Cal: 15 mg/dL (ref 5–40)

## 2023-09-24 LAB — COMPREHENSIVE METABOLIC PANEL
ALT: 10 [IU]/L (ref 0–32)
AST: 19 [IU]/L (ref 0–40)
Albumin: 4.5 g/dL (ref 4.0–5.0)
Alkaline Phosphatase: 42 [IU]/L — ABNORMAL LOW (ref 44–121)
BUN/Creatinine Ratio: 13 (ref 9–23)
BUN: 8 mg/dL (ref 6–20)
Bilirubin Total: 0.6 mg/dL (ref 0.0–1.2)
CO2: 24 mmol/L (ref 20–29)
Calcium: 9.2 mg/dL (ref 8.7–10.2)
Chloride: 102 mmol/L (ref 96–106)
Creatinine, Ser: 0.64 mg/dL (ref 0.57–1.00)
Globulin, Total: 2.3 g/dL (ref 1.5–4.5)
Glucose: 121 mg/dL — ABNORMAL HIGH (ref 70–99)
Potassium: 3.7 mmol/L (ref 3.5–5.2)
Sodium: 140 mmol/L (ref 134–144)
Total Protein: 6.8 g/dL (ref 6.0–8.5)
eGFR: 128 mL/min/{1.73_m2} (ref >59)

## 2023-09-24 LAB — TSH: TSH: 0.823 u[IU]/mL (ref 0.450–4.500)

## 2023-09-24 NOTE — Progress Notes (Signed)
Contacted via MyChart   Good evening Brenda Rangel, your labs have returned and overall look great with exception of mild elevation in glucose (sugar).  Did you eat or drinking anything sweet prior to visit?  Let me know as this would explain elevation.  Any questions? Keep being excellent!!  Thank you for allowing me to participate in your care.  I appreciate you. Kindest regards, Quianna Avery

## 2023-09-25 LAB — GC/CHLAMYDIA PROBE AMP
Chlamydia trachomatis, NAA: NEGATIVE
Neisseria Gonorrhoeae by PCR: NEGATIVE

## 2023-09-26 NOTE — Progress Notes (Signed)
Contacted via MyChart   Good morning Brenda Rangel, urine returned negative:)

## 2023-11-28 DIAGNOSIS — G245 Blepharospasm: Secondary | ICD-10-CM | POA: Diagnosis not present

## 2023-11-28 DIAGNOSIS — D485 Neoplasm of uncertain behavior of skin: Secondary | ICD-10-CM | POA: Diagnosis not present

## 2024-01-03 ENCOUNTER — Encounter: Payer: Self-pay | Admitting: Nurse Practitioner

## 2024-03-18 NOTE — Patient Instructions (Signed)
 Be Involved in Caring For Your Health:  Taking Medications When medications are taken as directed, they can greatly improve your health. But if they are not taken as prescribed, they may not work. In some cases, not taking them correctly can be harmful. To help ensure your treatment remains effective and safe, understand your medications and how to take them. Bring your medications to each visit for review by your provider.  Your lab results, notes, and after visit summary will be available on My Chart. We strongly encourage you to use this feature. If lab results are abnormal the clinic will contact you with the appropriate steps. If the clinic does not contact you assume the results are satisfactory. You can always view your results on My Chart. If you have questions regarding your health or results, please contact the clinic during office hours. You can also ask questions on My Chart.  We at [facility] are grateful that you chose [place] to provide your care. We strive to provide evidence-based and compassionate care and are always looking for feedback. If you get a survey from the clinic please complete this so we can hear your opinions.  Managing Anxiety, Adult After being diagnosed with anxiety, you may be relieved to know why you have felt or behaved a certain way. You may also feel overwhelmed about the treatment ahead and what it will mean for your life. With care and support, you can manage your anxiety. How to manage lifestyle changes Understanding the difference between stress and anxiety Although stress can play a role in anxiety, it is not the same as anxiety. Stress is your body's reaction to life changes and events, both good and bad. Stress is often caused by something external, such as a deadline, test, or competition. It normally goes away after the event has ended and will last just a few hours. But, stress can be ongoing and can lead to more than just stress. Anxiety is  caused by something internal, such as imagining a terrible outcome or worrying that something will go wrong that will greatly upset you. Anxiety often does not go away even after the event is over, and it can become a long-term (chronic) worry. Lowering stress and anxiety Talk with your health care provider or a counselor to learn more about lowering anxiety and stress. They may suggest tension-reduction techniques, such as: Music. Spend time creating or listening to music that you enjoy and that inspires you. Mindfulness-based meditation. Practice being aware of your normal breaths while not trying to control your breathing. It can be done while sitting or walking. Centering prayer. Focus on a word, phrase, or sacred image that means something to you and brings you peace. Deep breathing. Expand your stomach and inhale slowly through your nose. Hold your breath for 3-5 seconds. Then breathe out slowly, letting your stomach muscles relax. Self-talk. Learn to notice and spot thought patterns that lead to anxiety reactions. Change those patterns to thoughts that feel peaceful. Muscle relaxation. Take time to tense muscles and then relax them. Choose a tension-reduction technique that fits your lifestyle and personality. These techniques take time and practice. Set aside 5-15 minutes a day to do them. Specialized therapists can offer counseling and training in these techniques. The training to help with anxiety may be covered by some insurance plans. Other things you can do to manage stress and anxiety include: Keeping a stress diary. This can help you learn what triggers your reaction and then learn ways  to manage your response. Thinking about how you react to certain situations. You may not be able to control everything, but you can control your response. Making time for activities that help you relax and not feeling guilty about spending your time in this way. Doing visual imagery. This involves  imagining or creating mental pictures to help you relax. Practicing yoga. Through yoga poses, you can lower tension and relax.  Medicines Medicines for anxiety include: Antidepressant medicines. These are usually prescribed for long-term daily control. Anti-anxiety medicines. These may be added in severe cases, especially when panic attacks occur. When used together, medicines, psychotherapy, and tension-reduction techniques may be the most effective treatment. Relationships Relationships can play a big part in helping you recover. Spend more time connecting with trusted friends and family members. Think about going to couples counseling if you have a partner, taking family education classes, or going to family therapy. Therapy can help you and others better understand your anxiety. How to recognize changes in your anxiety Everyone responds differently to treatment for anxiety. Recovery from anxiety happens when symptoms lessen and stop interfering with your daily life at home or work. This may mean that you will start to: Have better concentration and focus. Worry will interfere less in your daily thinking. Sleep better. Be less irritable. Have more energy. Have improved memory. Try to recognize when your condition is getting worse. Contact your provider if your symptoms interfere with home or work and you feel like your condition is not improving. Follow these instructions at home: Activity Exercise. Adults should: Exercise for at least 150 minutes each week. The exercise should increase your heart rate and make you sweat (moderate-intensity exercise). Do strengthening exercises at least twice a week. Get the right amount and quality of sleep. Most adults need 7-9 hours of sleep each night. Lifestyle  Eat a healthy diet that includes plenty of vegetables, fruits, whole grains, low-fat dairy products, and lean protein. Do not eat a lot of foods that are high in fats, added sugars, or salt  (sodium). Make choices that simplify your life. Do not use any products that contain nicotine or tobacco. These products include cigarettes, chewing tobacco, and vaping devices, such as e-cigarettes. If you need help quitting, ask your provider. Avoid caffeine, alcohol, and certain over-the-counter cold medicines. These may make you feel worse. Ask your pharmacist which medicines to avoid. General instructions Take over-the-counter and prescription medicines only as told by your provider. Keep all follow-up visits. This is to make sure you are managing your anxiety well or if you need more support. Where to find support You can get help and support from: Self-help groups. Online and [employer]. A trusted spiritual leader. Couples counseling. Family education classes. Family therapy. Where to find more information You may find that joining a support group helps you deal with your anxiety. The following sources can help you find counselors or support groups near you: Mental Health [place]: mentalhealthamerica.net Anxiety and Depression Association of [place] (ADAA): adaa.org The [employer] on Mental Illness (NAMI): nami.org Contact a health care provider if: You have a hard time staying focused or finishing tasks. You spend many hours a day feeling worried about everyday life. You are very tired because you cannot stop worrying. You start to have headaches or often feel tense. You have chronic nausea or diarrhea. Get help right away if: Your heart feels like it is racing. You have shortness of breath. You have thoughts of hurting yourself or others. Get help  right away if you feel like you may hurt yourself or others, or have thoughts about taking your own life. Go to your nearest emergency room or: Call 911. Call the National Suicide Prevention Lifeline at 765-482-1593 or 988. This is open 24 hours a day. Text the Crisis Text Line at 504 124 9896. This information is not  intended to replace advice given to you by your health care provider. Make sure you discuss any questions you have with your health care provider. Document Revised: 07/13/2022 Document Reviewed: 01/25/2021 Elsevier Patient Education  2024 [employer].

## 2024-03-23 ENCOUNTER — Ambulatory Visit: Payer: Self-pay | Admitting: Nurse Practitioner

## 2024-03-23 ENCOUNTER — Encounter: Payer: Self-pay | Admitting: Nurse Practitioner

## 2024-03-23 VITALS — BP 118/74 | HR 71 | Temp 98.4°F | Ht 63.0 in | Wt 120.0 lb

## 2024-03-23 DIAGNOSIS — F411 Generalized anxiety disorder: Secondary | ICD-10-CM

## 2024-03-23 DIAGNOSIS — R238 Other skin changes: Secondary | ICD-10-CM | POA: Diagnosis not present

## 2024-03-23 MED ORDER — TRIAMCINOLONE ACETONIDE 0.1 % EX CREA: 1.0000 | TOPICAL_CREAM | Freq: Two times a day (BID) | CUTANEOUS | 0 refills | Status: DC
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
## 2024-03-23 NOTE — Assessment & Plan Note (Signed)
 Ongoing, with continued elevation in scores however she wishes to maintain current doses.  Denies SI/HI.  Cntinue Effexor  75 MG daily and recommend she take Buspar  10 MG BID scheduled and not PRN, will adjust further as needed, educated her on medications and possible side effects. Recommend meditation and relaxation exercises at home + continue therapy sessions.  Discussed with her if needed could add on low dose Propranolol to assist with anxiety and tremors, could take on PRN basis.  Educated her on this medication and side effects.  Her HR at baseline is in 90 range.  Refills sent.  Continue therapy sessions.

## 2024-06-28 DIAGNOSIS — G5622 Lesion of ulnar nerve, left upper limb: Secondary | ICD-10-CM | POA: Diagnosis not present

## 2024-06-28 DIAGNOSIS — Z1331 Encounter for screening for depression: Secondary | ICD-10-CM | POA: Diagnosis not present

## 2024-09-03 DIAGNOSIS — B349 Viral infection, unspecified: Secondary | ICD-10-CM | POA: Diagnosis not present

## 2024-09-03 DIAGNOSIS — J029 Acute pharyngitis, unspecified: Secondary | ICD-10-CM | POA: Diagnosis not present

## 2024-09-03 DIAGNOSIS — R0981 Nasal congestion: Secondary | ICD-10-CM | POA: Diagnosis not present

## 2024-09-29 ENCOUNTER — Encounter: Payer: Self-pay | Admitting: Emergency Medicine

## 2024-09-29 ENCOUNTER — Ambulatory Visit
Admission: EM | Admit: 2024-09-29 | Discharge: 2024-09-29 | Disposition: A | Discharge status: Home / Self Care | Attending: Emergency Medicine | Admitting: Emergency Medicine

## 2024-09-29 DIAGNOSIS — N898 Other specified noninflammatory disorders of vagina: Secondary | ICD-10-CM | POA: Insufficient documentation

## 2024-09-29 DIAGNOSIS — R399 Unspecified symptoms and signs involving the genitourinary system: Secondary | ICD-10-CM | POA: Diagnosis not present

## 2024-09-29 DIAGNOSIS — N390 Urinary tract infection, site not specified: Secondary | ICD-10-CM | POA: Diagnosis not present

## 2024-09-29 LAB — POCT URINE DIPSTICK
Glucose, UA: NEGATIVE mg/dL
Ketones, POC UA: NEGATIVE mg/dL
Nitrite, UA: NEGATIVE
Protein Ur, POC: >=300 mg/dL — AB
Spec Grav, UA: 1.025 (ref 1.010–1.025)
Urobilinogen, UA: 2 U/dL — AB
pH, UA: 7 (ref 5.0–8.0)

## 2024-09-29 MED ORDER — NITROFURANTOIN MONOHYD MACRO 100 MG PO CAPS: 100.0000 mg | ORAL_CAPSULE | Freq: Two times a day (BID) | ORAL | 0 refills | Status: DC

## 2024-09-29 MED ORDER — PHENAZOPYRIDINE HCL 200 MG PO TABS: 200.0000 mg | ORAL_TABLET | Freq: Three times a day (TID) | ORAL | 0 refills | Status: DC

## 2024-09-29 NOTE — ED Triage Notes (Signed)
 Patient c/o dysuria, urinary frequency that started a week ago.  Patient reports pain after she urinates. Patient reports constant discharge.  Patient denies vaginal odor.  Patient denies vaginal itching.

## 2024-09-29 NOTE — ED Provider Notes (Signed)
 [facility]    CSN: [id number] Arrival date & time: 09/29/24  1434      History   Chief Complaint Chief Complaint  Patient presents with   Dysuria    HPI Brenda Brenda Rangel is a 23 y.o. female.   HPI  23 year old female with past medical history significant for abdominal migraine and anxiety presents for evaluation of UTI symptoms that started approximately a week ago.  This includes dysuria with urgency and frequency.  She also has noticed a slight pink tinge when she wipes but she has not noticed any overt hematuria.  She denies any fever, nausea or vomiting, low back pain, or abdominal pain.  She is experiencing a normal vaginal discharge without odor or color.  No vaginal itching.  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   Generalized anxiety disorder 11/16/2019   Uses birth control 08/28/2019    Past Surgical History:  Procedure Laterality Date   ADENOIDECTOMY     COLONOSCOPY WITH PROPOFOL  N/A 09/17/2021   Procedure: COLONOSCOPY WITH PROPOFOL ;  Surgeon: Janalyn Keene NOVAK, MD;  Location: [facility] ENDOS[facility];  Service: Endoscopy;  Laterality: N/A;   NO PAST SURGERIES     TYMPANOSTOMY TUBE PLACEMENT      OB History     Gravida  0   Para  0   Term  0   Preterm  0   AB  0   Living  0      SAB  0   IAB  0   Ectopic  0   Multiple  0   Live Births  0            Home Medications    Prior to Admission medications  Medication Sig Start Date End Date Taking? Authorizing Provider  busPIRone  (BUSPAR ) 10 MG tablet Take 1 tablet (10 mg total) by mouth 2 (two) times daily. 02/04/23  Yes Cannady, Jolene T, NP  nitrofurantoin , macrocrystal-monohydrate, (MACROBID ) 100 MG capsule Take 1 capsule (100 mg total) by mouth 2 (two) times daily. 09/29/24  Yes Bernardino Ditch, NP   norethindrone-ethinyl estradiol-FE (JUNEL FE 1/20) 1-20 MG-MCG tablet Take 1 tablet by mouth daily. 09/23/23  Yes Cannady, Jolene T, NP  phenazopyridine  (PYRIDIUM ) 200 MG tablet Take 1 tablet (200 mg total) by mouth 3 (three) times daily. 09/29/24  Yes Bernardino Ditch, NP  venlafaxine  XR (EFFEXOR  XR) 75 MG 24 hr capsule Take 1 capsule (75 mg total) by mouth daily with breakfast. 09/23/23  Yes Cannady, Jolene T, NP  triamcinolone  cream (KENALOG ) 0.1 % Apply 1 Application topically 2 (two) times daily. 03/23/24   Cannady, Jolene T, NP    Family History Family History  Adopted: Yes  Problem Relation Age of Onset   Anxiety disorder Mother    Depression Mother    Anxiety disorder Father    Depression Father    Allergic rhinitis Sister    Pectus excavatum Sister    Breast cancer Maternal Grandmother    Pectus excavatum Adoptive Father     Social History Social History[1]   Allergies   Hazel tree pollen [corylus] and Other   Review of Systems Review of Systems  Constitutional:  Negative for fever.  Gastrointestinal:  Negative for abdominal pain, nausea and vomiting.  Genitourinary:  Positive for dysuria, frequency, urgency  and vaginal discharge. Negative for hematuria and vaginal pain.  Musculoskeletal:  Negative for back pain.     Physical Exam Triage Vital Signs ED Triage Vitals  Encounter Vitals Group     BP      Girls Systolic BP Percentile      Girls Diastolic BP Percentile      Boys Systolic BP Percentile      Boys Diastolic BP Percentile      Pulse      Resp      Temp      Temp src      SpO2      Weight      Height      Head Circumference      Peak Flow      Pain Score      Pain Loc      Pain Education      Exclude from Growth Chart    No data found.  Updated Vital Signs BP 129/85 (BP Location: Right Arm)   Pulse 84   Temp 98.9 F (37.2 C) (Oral)   Resp 14   Ht 5' 3 (1.6 m)   Wt 119 lb 14.9 oz (54.4 kg)   LMP 09/15/2024 (Approximate)   SpO2 98%    BMI 21.24 kg/m   Visual Acuity Right Eye Distance:   Left Eye Distance:   Bilateral Distance:    Right Eye Near:   Left Eye Near:    Bilateral Near:     Physical Exam Vitals and nursing note reviewed.  Constitutional:      Appearance: Normal appearance. She is not ill-appearing.  HENT:     Head: Normocephalic and atraumatic.  Cardiovascular:     Rate and Rhythm: Normal rate and regular rhythm.     Pulses: Normal pulses.     Heart sounds: Normal heart sounds. No murmur heard.    No friction rub. No gallop.  Pulmonary:     Effort: Pulmonary effort is normal.     Breath sounds: Normal breath sounds. No wheezing, rhonchi or rales.  Abdominal:     Tenderness: There is no right CVA tenderness or left CVA tenderness.  Skin:    General: Skin is warm and dry.     Capillary Refill: Capillary refill takes less than 2 seconds.     Findings: No rash.  Neurological:     General: No focal deficit present.     Mental Status: She is alert and oriented to person, place, and time.      UC Treatments / Results  Labs (all labs ordered are listed, but only abnormal results are displayed) Labs Reviewed  POCT URINE DIPSTICK - Abnormal; Notable for the following components:      Result Value   Clarity, UA cloudy (*)    Bilirubin, UA small (*)    Blood, UA large (*)    Protein Ur, POC >=300 (*)    Urobilinogen, UA 2.0 (*)    Leukocytes, UA Large (3+) (*)    All other components within normal limits  URINE CULTURE  CERVICOVAGINAL ANCILLARY ONLY    EKG   Radiology No results found.  Procedures Procedures (including critical care time)  Medications Ordered in UC Medications - No data to display  Initial Impression / Assessment and Plan / UC Course  I have reviewed the triage vital signs and the nursing notes.  Pertinent labs & imaging results that were available during my care of the patient were reviewed by  me and considered in my medical decision making (see chart for  details).   Patient is a nontoxic-appearing 23 year old female presenting for evaluation of UTI symptoms that been going on for the last week as outlined in HPI above.  She reports that she does not have a history of UTIs but she has recently had some episodes where she has been holding her urine and she also was recently in a hot tub.  She also reports that she is having a constant vaginal discharge but reports that it is a clear discharge that is normal for her.  She denies any change in color or texture.  Also no odor.  The patient does endorse that her urine has been cloudy in appearance.  A vaginal cytology swab was collected at triage.  I will also order a urinalysis to assess for the presence of UTI.    Urine dipstick shows cloudy appearance with small bilirubin, large RBCs, greater than 300 protein, 2 urobilinogen, and large leukocyte esterase.  Negative for nitrates.  I will send urine for culture.  I will discharge patient with diagnosis of UTI and I will start her on Macrobid  100 mg twice daily for 5 days along with Pyridium  200 mg every 8 hours as needed for urinary discomfort.  Return precautions reviewed.  Urine culture grew out E. coli which is susceptible to Macrobid .  No change in therapy at this time.   Final Clinical Impressions(s) / UC Diagnoses   Final diagnoses:  UTI symptoms  Lower urinary tract infectious disease     Discharge Instructions      Take the Macrobid  twice daily for 5 days with food for treatment of urinary tract infection.  Use the Pyridium  every 8 hours as needed for urinary discomfort.  This will turn your urine a bright red-orange.  Increase your oral fluid intake so that you increase your urine production and or flushing your urinary system.  Take an over-the-counter probiotic, such as Culturelle-Align-Activia, 1 hour after each dose of antibiotic to prevent diarrhea or yeast infections from forming.  We will culture urine and change the  antibiotics if necessary.  Return for reevaluation, or see your primary care provider, for any new or worsening symptoms.      ED Prescriptions     Medication Sig Dispense Auth. Provider   nitrofurantoin , macrocrystal-monohydrate, (MACROBID ) 100 MG capsule Take 1 capsule (100 mg total) by mouth 2 (two) times daily. 10 capsule Bernardino Ditch, NP   phenazopyridine  (PYRIDIUM ) 200 MG tablet Take 1 tablet (200 mg total) by mouth 3 (three) times daily. 6 tablet Bernardino Ditch, NP      PDMP not reviewed this encounter.    Bernardino Ditch, NP 09/29/24 1509     [1]  Social History Tobacco Use   Smoking status: Never   Smokeless tobacco: Never  Vaping Use   Vaping status: Never Used  Substance Use Topics   Alcohol use: Yes    Comment: soccially   Drug use: Never     Bernardino Ditch, NP 10/03/24 [phone]

## 2024-09-29 NOTE — Discharge Instructions (Addendum)

## 2024-10-01 ENCOUNTER — Ambulatory Visit (HOSPITAL_COMMUNITY): Payer: Self-pay

## 2024-10-01 LAB — CERVICOVAGINAL ANCILLARY ONLY
Bacterial Vaginitis (gardnerella): NEGATIVE
Candida Glabrata: NEGATIVE
Candida Vaginitis: POSITIVE — AB
Chlamydia: NEGATIVE
Comment: NEGATIVE
Comment: NEGATIVE
Comment: NEGATIVE
Comment: NEGATIVE
Comment: NEGATIVE
Comment: NORMAL
Neisseria Gonorrhea: NEGATIVE
Trichomonas: NEGATIVE

## 2024-10-01 MED ORDER — FLUCONAZOLE 150 MG PO TABS: 150.0000 mg | ORAL_TABLET | Freq: Once | ORAL | 0 refills | Status: AC

## 2024-10-02 LAB — URINE CULTURE
Culture: 40000 — AB
Special Requests: NORMAL

## 2024-10-13 NOTE — Patient Instructions (Signed)
 Be Involved in Caring For Your Health:  Taking Medications When medications are taken as directed, they can greatly improve your health. But if they are not taken as prescribed, they may not work. In some cases, not taking them correctly can be harmful. To help ensure your treatment remains effective and safe, understand your medications and how to take them. Bring your medications to each visit for review by your provider.  Your lab results, notes, and after visit summary will be available on My Chart. We strongly encourage you to use this feature. If lab results are abnormal the clinic will contact you with the appropriate steps. If the clinic does not contact you assume the results are satisfactory. You can always view your results on My Chart. If you have questions regarding your health or results, please contact the clinic during office hours. You can also ask questions on My Chart.  We at [facility] are grateful that you chose [place] to provide your care. We strive to provide evidence-based and compassionate care and are always looking for feedback. If you get a survey from the clinic please complete this so we can hear your opinions.  Managing Anxiety, Adult After being diagnosed with anxiety, you may be relieved to know why you have felt or behaved a certain way. You may also feel overwhelmed about the treatment ahead and what it will mean for your life. With care and support, you can manage your anxiety. How to manage lifestyle changes Understanding the difference between stress and anxiety Although stress can play a role in anxiety, it is not the same as anxiety. Stress is your body's reaction to life changes and events, both good and bad. Stress is often caused by something external, such as a deadline, test, or competition. It normally goes away after the event has ended and will last just a few hours. But, stress can be ongoing and can lead to more than just stress. Anxiety is  caused by something internal, such as imagining a terrible outcome or worrying that something will go wrong that will greatly upset you. Anxiety often does not go away even after the event is over, and it can become a long-term (chronic) worry. Lowering stress and anxiety Talk with your health care provider or a counselor to learn more about lowering anxiety and stress. They may suggest tension-reduction techniques, such as: Music. Spend time creating or listening to music that you enjoy and that inspires you. Mindfulness-based meditation. Practice being aware of your normal breaths while not trying to control your breathing. It can be done while sitting or walking. Centering prayer. Focus on a word, phrase, or sacred image that means something to you and brings you peace. Deep breathing. Expand your stomach and inhale slowly through your nose. Hold your breath for 3-5 seconds. Then breathe out slowly, letting your stomach muscles relax. Self-talk. Learn to notice and spot thought patterns that lead to anxiety reactions. Change those patterns to thoughts that feel peaceful. Muscle relaxation. Take time to tense muscles and then relax them. Choose a tension-reduction technique that fits your lifestyle and personality. These techniques take time and practice. Set aside 5-15 minutes a day to do them. Specialized therapists can offer counseling and training in these techniques. The training to help with anxiety may be covered by some insurance plans. Other things you can do to manage stress and anxiety include: Keeping a stress diary. This can help you learn what triggers your reaction and then learn ways  to manage your response. Thinking about how you react to certain situations. You may not be able to control everything, but you can control your response. Making time for activities that help you relax and not feeling guilty about spending your time in this way. Doing visual imagery. This involves  imagining or creating mental pictures to help you relax. Practicing yoga. Through yoga poses, you can lower tension and relax.  Medicines Medicines for anxiety include: Antidepressant medicines. These are usually prescribed for long-term daily control. Anti-anxiety medicines. These may be added in severe cases, especially when panic attacks occur. When used together, medicines, psychotherapy, and tension-reduction techniques may be the most effective treatment. Relationships Relationships can play a big part in helping you recover. Spend more time connecting with trusted friends and family members. Think about going to couples counseling if you have a partner, taking family education classes, or going to family therapy. Therapy can help you and others better understand your anxiety. How to recognize changes in your anxiety Everyone responds differently to treatment for anxiety. Recovery from anxiety happens when symptoms lessen and stop interfering with your daily life at home or work. This may mean that you will start to: Have better concentration and focus. Worry will interfere less in your daily thinking. Sleep better. Be less irritable. Have more energy. Have improved memory. Try to recognize when your condition is getting worse. Contact your provider if your symptoms interfere with home or work and you feel like your condition is not improving. Follow these instructions at home: Activity Exercise. Adults should: Exercise for at least 150 minutes each week. The exercise should increase your heart rate and make you sweat (moderate-intensity exercise). Do strengthening exercises at least twice a week. Get the right amount and quality of sleep. Most adults need 7-9 hours of sleep each night. Lifestyle  Eat a healthy diet that includes plenty of vegetables, fruits, whole grains, low-fat dairy products, and lean protein. Do not eat a lot of foods that are high in fats, added sugars, or salt  (sodium). Make choices that simplify your life. Do not use any products that contain nicotine or tobacco. These products include cigarettes, chewing tobacco, and vaping devices, such as e-cigarettes. If you need help quitting, ask your provider. Avoid caffeine, alcohol, and certain over-the-counter cold medicines. These may make you feel worse. Ask your pharmacist which medicines to avoid. General instructions Take over-the-counter and prescription medicines only as told by your provider. Keep all follow-up visits. This is to make sure you are managing your anxiety well or if you need more support. Where to find support You can get help and support from: Self-help groups. Online and [employer]. A trusted spiritual leader. Couples counseling. Family education classes. Family therapy. Where to find more information You may find that joining a support group helps you deal with your anxiety. The following sources can help you find counselors or support groups near you: Mental Health [place]: mentalhealthamerica.net Anxiety and Depression Association of [place] (ADAA): adaa.org The [employer] on Mental Illness (NAMI): nami.org Contact a health care provider if: You have a hard time staying focused or finishing tasks. You spend many hours a day feeling worried about everyday life. You are very tired because you cannot stop worrying. You start to have headaches or often feel tense. You have chronic nausea or diarrhea. Get help right away if: Your heart feels like it is racing. You have shortness of breath. You have thoughts of hurting yourself or others. Get help  right away if you feel like you may hurt yourself or others, or have thoughts about taking your own life. Go to your nearest emergency room or: Call 911. Call the National Suicide Prevention Lifeline at 765-482-1593 or 988. This is open 24 hours a day. Text the Crisis Text Line at 504 124 9896. This information is not  intended to replace advice given to you by your health care provider. Make sure you discuss any questions you have with your health care provider. Document Revised: 07/13/2022 Document Reviewed: 01/25/2021 Elsevier Patient Education  2024 [employer].

## 2024-10-19 ENCOUNTER — Encounter: Payer: Self-pay | Admitting: Nurse Practitioner

## 2024-10-19 ENCOUNTER — Ambulatory Visit: Admitting: Nurse Practitioner

## 2024-10-19 VITALS — BP 119/78 | HR 98 | Temp 98.9°F | Ht 63.0 in | Wt 122.4 lb

## 2024-10-19 DIAGNOSIS — F411 Generalized anxiety disorder: Secondary | ICD-10-CM

## 2024-10-19 DIAGNOSIS — G43009 Migraine without aura, not intractable, without status migrainosus: Secondary | ICD-10-CM | POA: Diagnosis not present

## 2024-10-19 DIAGNOSIS — Z13220 Encounter for screening for lipoid disorders: Secondary | ICD-10-CM | POA: Diagnosis not present

## 2024-10-19 DIAGNOSIS — Z789 Other specified health status: Secondary | ICD-10-CM

## 2024-10-19 DIAGNOSIS — Z0000 Encounter for general adult medical examination without abnormal findings: Secondary | ICD-10-CM | POA: Diagnosis not present

## 2024-10-19 DIAGNOSIS — Z136 Encounter for screening for cardiovascular disorders: Secondary | ICD-10-CM

## 2024-10-19 LAB — POCT URINE PREGNANCY: Preg Test, Ur: NEGATIVE

## 2024-10-19 MED ORDER — JUNEL FE 1/20 1-20 MG-MCG PO TABS: 1.0000 | ORAL_TABLET | Freq: Every day | ORAL | 12 refills | Status: AC

## 2024-10-19 MED ORDER — BUSPIRONE HCL 10 MG PO TABS: 10.0000 mg | ORAL_TABLET | Freq: Two times a day (BID) | ORAL | 4 refills | Status: AC

## 2024-10-19 MED ORDER — SUMATRIPTAN SUCCINATE 25 MG PO TABS: 25.0000 mg | ORAL_TABLET | ORAL | 0 refills | Status: AC | PRN

## 2024-10-19 MED ORDER — VENLAFAXINE HCL ER 75 MG PO CP24: 75.0000 mg | ORAL_CAPSULE | Freq: Every day | ORAL | 4 refills | Status: AC

## 2024-10-19 NOTE — Progress Notes (Signed)
 "  BP 119/78 (BP Location: Left Arm, Patient Position: Sitting, Cuff Size: Normal)   Pulse 98   Temp 98.9 F (37.2 C) (Oral)   Ht 5' 3 (1.6 m)   Wt 122 lb 6.4 oz (55.5 kg)   LMP 10/08/2024 (Approximate)   SpO2 98%   BMI 21.68 kg/m    Subjective:    Patient ID: Brenda Rangel, female    DOB: Feb 20, 2001, 24 y.o.   MRN: [id number]  HPI: Brenda Rangel is a 24 y.o. female presenting on 10/19/2024 for comprehensive medical examination. Current medical complaints include:none  She currently lives with: family Menopausal Symptoms: no  DEPRESSION Takes Effexor  and Buspar .  More stressors over the holidays. Having more migraines since the holidays, on the left side of the head. More stressors with work and home. It will typically last all Rangel with photosensitivity and nausea when present.   Mood status: stable Satisfied with current treatment?: yes Symptom severity: moderate  Duration of current treatment : chronic Side effects: no Medication compliance: good compliance Psychotherapy/counseling: yes current Depressed mood: occasional Anxious mood: occasional Anhedonia: no Significant weight loss or gain: no Insomnia: occasional lack of sleep due to holidays Fatigue: yes Feelings of worthlessness or guilt: no Impaired concentration/indecisiveness: yes Suicidal ideations: no Hopelessness: no Crying spells: no    10/19/2024    3:59 PM 03/23/2024    4:04 PM 09/23/2023    4:09 PM 02/04/2023   10:44 AM 08/06/2022    2:56 PM  Depression screen PHQ 2/9  Decreased Interest 2 2 2 1 2   Down, Depressed, Hopeless 0 1 2 1 1   PHQ - 2 Score 2 3 4 2 3   Altered sleeping 3 1 3 3 1   Tired, decreased energy 3 2 3 2 2   Change in appetite 2 1 0 2 1  Feeling bad or failure about yourself  0 0 1 0 0  Trouble concentrating 2 2 3 1 3   Moving slowly or fidgety/restless 1 3 2 1 2   Suicidal thoughts 0 0 0 0 0  PHQ-9 Score 13 12  16  11  12    Difficult doing work/chores Somewhat difficult Somewhat  difficult  Somewhat difficult Somewhat difficult     Data saved with a previous flowsheet row definition      10/19/2024    3:59 PM 03/23/2024    4:04 PM 09/23/2023    4:09 PM 02/04/2023   10:44 AM  GAD 7 : Generalized Anxiety Score  Nervous, Anxious, on Edge 2 2 3 2   Control/stop worrying 1 2 1 2   Worry too much - different things 2 3 3 2   Trouble relaxing 3 3 3 2   Restless 2 3 2 2   Easily annoyed or irritable 3 1 3 2   Afraid - awful might happen 0 1 0 0  Total GAD 7 Score 13 15 15 12   Anxiety Difficulty Somewhat difficult Somewhat difficult  Somewhat difficult   Past Medical History:  Past Medical History:  Diagnosis Date   Abdominal migraine    Anxiety     Surgical History:  Past Surgical History:  Procedure Laterality Date   ADENOIDECTOMY     COLONOSCOPY WITH PROPOFOL  N/A 09/17/2021   Procedure: COLONOSCOPY WITH PROPOFOL ;  Surgeon: Janalyn Keene NOVAK, MD;  Location: [facility];  Service: Endoscopy;  Laterality: N/A;   NO PAST SURGERIES     TYMPANOSTOMY TUBE PLACEMENT      Medications:  No current outpatient medications on file prior to visit.  No current facility-administered medications on file prior to visit.    Allergies:  Allergies  Allergen Reactions   Dust Mite Extract Dermatitis   Hazel Tree Pollen [Corylus] Rash   Mixed Feathers Dermatitis   Other Rash    Shrimp, dust mites, mixed feathers    Shellfish Allergy  Dermatitis    Social History:  Social History   Socioeconomic History   Marital status: Single    Spouse name: Not on file   Number of children: Not on file   Years of education: Not on file   Highest education level: Not on file  Occupational History    Comment: [employer] --- associates in science  Tobacco Use   Smoking status: Never   Smokeless tobacco: Never  Vaping Use   Vaping status: Never Used  Substance and Sexual Activity   Alcohol use: Yes    Comment: soccially   Drug use: Never   Sexual activity: Yes    Birth  control/protection: Pill    Comment: monogamous  Other Topics Concern   Not on file  Social History Narrative   Not on file   Social Drivers of Health   Tobacco Use: Low Risk (10/19/2024)   Patient History    Smoking Tobacco Use: Never    Smokeless Tobacco Use: Never    Passive Exposure: Not on file  Financial Resource Strain: Not on file  Food Insecurity: Not on file  Transportation Needs: Not on file  Physical Activity: Not on file  Stress: Not on file  Social Connections: Not on file  Intimate Partner Violence: Not on file  Depression (PHQ2-9): High Risk (10/19/2024)   Depression (PHQ2-9)    PHQ-2 Score: 13  Alcohol Screen: Not on file  Housing: Not on file  Utilities: Not on file  Health Literacy: Not on file   Social History   Tobacco Use  Smoking Status Never  Smokeless Tobacco Never   Social History   Substance and Sexual Activity  Alcohol Use Yes   Comment: soccially    Family History:  Family History  Adopted: Yes  Problem Relation Age of Onset   Anxiety disorder Mother    Depression Mother    Anxiety disorder Father    Depression Father    Allergic rhinitis Sister    Pectus excavatum Sister    Breast cancer Maternal Grandmother    Pectus excavatum Adoptive Father     Past medical history, surgical history, medications, allergies, family history and social history reviewed with patient today and changes made to appropriate areas of the chart.   ROS All other ROS negative except what is listed above and in the HPI.      Objective:    BP 119/78 (BP Location: Left Arm, Patient Position: Sitting, Cuff Size: Normal)   Pulse 98   Temp 98.9 F (37.2 C) (Oral)   Ht 5' 3 (1.6 m)   Wt 122 lb 6.4 oz (55.5 kg)   LMP 10/08/2024 (Approximate)   SpO2 98%   BMI 21.68 kg/m   Wt Readings from Last 3 Encounters:  10/19/24 122 lb 6.4 oz (55.5 kg)  09/29/24 119 lb 14.9 oz (54.4 kg)  03/23/24 120 lb (54.4 kg)    Physical Exam Vitals and nursing note  reviewed.  Constitutional:      General: She is awake. She is not in acute distress.    Appearance: She is well-developed and well-groomed. She is not ill-appearing or toxic-appearing.  HENT:     Head: Normocephalic and  atraumatic.     Right Ear: Hearing, tympanic membrane, ear canal and external ear normal. No drainage.     Left Ear: Hearing, tympanic membrane, ear canal and external ear normal. No drainage.     Nose: Nose normal.     Right Sinus: No maxillary sinus tenderness or frontal sinus tenderness.     Left Sinus: No maxillary sinus tenderness or frontal sinus tenderness.     Mouth/Throat:     Mouth: Mucous membranes are moist.     Pharynx: Oropharynx is clear. Uvula midline. No pharyngeal swelling, oropharyngeal exudate or posterior oropharyngeal erythema.  Eyes:     General: Lids are normal.        Right eye: No discharge.        Left eye: No discharge.     Extraocular Movements: Extraocular movements intact.     Conjunctiva/sclera: Conjunctivae normal.     Pupils: Pupils are equal, round, and reactive to light.     Visual Fields: Right eye visual fields normal and left eye visual fields normal.  Neck:     Thyroid : No thyromegaly.     Vascular: No carotid bruit.     Trachea: Trachea normal.  Cardiovascular:     Rate and Rhythm: Normal rate and regular rhythm.     Heart sounds: Normal heart sounds. No murmur heard.    No gallop.  Pulmonary:     Effort: Pulmonary effort is normal. No accessory muscle usage or respiratory distress.     Breath sounds: Normal breath sounds. No decreased breath sounds, wheezing or rales.  Abdominal:     General: Bowel sounds are normal.     Palpations: Abdomen is soft. There is no hepatomegaly or splenomegaly.     Tenderness: There is no abdominal tenderness.  Musculoskeletal:        General: Normal range of motion.     Cervical back: Normal range of motion and neck supple.     Right lower leg: No edema.     Left lower leg: No edema.   Lymphadenopathy:     Head:     Right side of head: No submental, submandibular, tonsillar, preauricular or posterior auricular adenopathy.     Left side of head: No submental, submandibular, tonsillar, preauricular or posterior auricular adenopathy.     Cervical: No cervical adenopathy.  Skin:    General: Skin is warm and dry.     Capillary Refill: Capillary refill takes less than 2 seconds.     Findings: No rash.  Neurological:     Mental Status: She is alert and oriented to person, place, and time.     Cranial Nerves: Cranial nerves 2-12 are intact.     Motor: Motor function is intact.     Coordination: Coordination is intact.     Gait: Gait is intact.     Deep Tendon Reflexes: Reflexes are normal and symmetric.     Reflex Scores:      Brachioradialis reflexes are 2+ on the right side and 2+ on the left side.      Patellar reflexes are 2+ on the right side and 2+ on the left side. Psychiatric:        Attention and Perception: Attention normal.        Mood and Affect: Mood normal.        Speech: Speech normal.        Behavior: Behavior normal. Behavior is cooperative.        Thought Content: Thought content  normal.        Judgment: Judgment normal.    Results for orders placed or performed in visit on 10/19/24  CBC with Differential/Platelet   Collection Time: 10/19/24  6:02 AM  Result Value Ref Range   WBC 7.4 3.4 - 10.8 x10E3/uL   RBC 4.75 3.77 - 5.28 x10E6/uL   Hemoglobin 14.3 11.1 - 15.9 g/dL   Hematocrit 56.9 65.9 - 46.6 %   MCV 91 79 - 97 fL   MCH 30.1 26.6 - 33.0 pg   MCHC 33.3 31.5 - 35.7 g/dL   RDW 87.7 88.2 - 84.5 %   Platelets 269 150 - 450 x10E3/uL   Neutrophils 59 Not Estab. %   Lymphs 27 Not Estab. %   Monocytes 10 Not Estab. %   Eos 3 Not Estab. %   Basos 1 Not Estab. %   Neutrophils Absolute 4.3 1.4 - 7.0 x10E3/uL   Lymphocytes Absolute 2.0 0.7 - 3.1 x10E3/uL   Monocytes Absolute 0.7 0.1 - 0.9 x10E3/uL   EOS (ABSOLUTE) 0.2 0.0 - 0.4 x10E3/uL    Basophils Absolute 0.1 0.0 - 0.2 x10E3/uL   Immature Granulocytes 0 Not Estab. %   Immature Grans (Abs) 0.0 0.0 - 0.1 x10E3/uL  Comprehensive metabolic panel with GFR   Collection Time: 10/19/24  6:02 AM  Result Value Ref Range   Glucose 88 70 - 99 mg/dL   BUN 10 6 - 20 mg/dL   Creatinine, Ser 9.38 0.57 - 1.00 mg/dL   eGFR 870 >40 fO/fpw/8.26   BUN/Creatinine Ratio 16 9 - 23   Sodium 140 134 - 144 mmol/L   Potassium 4.2 3.5 - 5.2 mmol/L   Chloride 102 96 - 106 mmol/L   CO2 25 20 - 29 mmol/L   Calcium 9.3 8.7 - 10.2 mg/dL   Total Protein 6.9 6.0 - 8.5 g/dL   Albumin 4.7 4.0 - 5.0 g/dL   Globulin, Total 2.2 1.5 - 4.5 g/dL   Bilirubin Total 0.7 0.0 - 1.2 mg/dL   Alkaline Phosphatase 44 41 - 116 IU/L   AST 21 0 - 40 IU/L   ALT 12 0 - 32 IU/L  Lipid Panel w/o Chol/HDL Ratio   Collection Time: 10/19/24  6:02 AM  Result Value Ref Range   Cholesterol, Total 163 100 - 199 mg/dL   Triglycerides 899 0 - 149 mg/dL   HDL 67 >60 mg/dL   VLDL Cholesterol Cal 18 5 - 40 mg/dL   LDL Chol Calc (NIH) 78 0 - 99 mg/dL  TSH   Collection Time: 10/19/24  6:02 AM  Result Value Ref Range   TSH 0.583 0.450 - 4.500 uIU/mL  POCT urine pregnancy   Collection Time: 10/19/24  4:36 PM  Result Value Ref Range   Preg Test, Ur Negative Negative      Assessment & Plan:   Problem List Items Addressed This Visit       Cardiovascular and Mediastinum   Migraine headache   Currently improving, but has intermittent episodes. More when stressors present. Trial Imitrex  as needed. Educated her on this and side effects to monitor for. Continue OTC regimen as needed, which offers occasional benefit. No red flags and neuro exam reassuring. Could consider Propranolol in future for preventative if needed + this could offer benefit to anxiety as well.      Relevant Medications   venlafaxine  XR (EFFEXOR  XR) 75 MG 24 hr capsule   SUMAtriptan  (IMITREX ) 25 MG tablet     Other  Uses birth control   Ongoing, refills  sent in.  Labs obtained today.  Will place new referral to GYN for ongoing care.      Relevant Orders   Comprehensive metabolic panel with GFR (Completed)   POCT urine pregnancy (Completed)   Ambulatory referral to Gynecology   Generalized anxiety disorder - Primary   Ongoing, with continued elevation in scores however she wishes to maintain current doses.  Denies SI/HI.  Continue Effexor  75 MG daily and recommend she take Buspar  10 MG BID, will adjust further as needed, educated her on medications and possible side effects. Recommend meditation and relaxation exercises at home + continue therapy sessions.  Discussed with her if needed could add on low dose Propranolol to assist with anxiety and tremors, could take on PRN basis.  Educated her on this medication and side effects.  Her HR at baseline is in 90 range.  Refills sent.  Recommend return to therapy sessions.      Relevant Medications   busPIRone  (BUSPAR ) 10 MG tablet   venlafaxine  XR (EFFEXOR  XR) 75 MG 24 hr capsule   Other Relevant Orders   CBC with Differential/Platelet (Completed)   TSH (Completed)   Other Visit Diagnoses       Encounter for lipid screening for cardiovascular disease       Lipid panel today.   Relevant Orders   Comprehensive metabolic panel with GFR (Completed)   Lipid Panel w/o Chol/HDL Ratio (Completed)     Encounter for annual physical exam       Annual physical today with labs and health maintenance reviewed, discussed with patient.        Follow up plan: Return in about 4 weeks (around 11/16/2024) for Migraines + 6 month visit for Anxiety.   LABORATORY TESTING:  - Pap smear: up to date  IMMUNIZATIONS:   - Tdap: Tetanus vaccination status reviewed: last tetanus booster within 10 years. - Influenza: Refused - Pneumovax: Not applicable - Prevnar: Not applicable - COVID: Refused - HPV: Up to date - Shingrix vaccine: Not applicable  SCREENING: -Mammogram: Not applicable  - Colonoscopy: Not  applicable  - Bone Density: Not applicable  -Hearing Test: Not applicable  -Spirometry: Not applicable   PATIENT COUNSELING:   Advised to take 1 mg of folate supplement per Rangel if capable of pregnancy.   Sexuality: Discussed sexually transmitted diseases, partner selection, use of condoms, avoidance of unintended pregnancy  and contraceptive alternatives.   Advised to avoid cigarette smoking.  I discussed with the patient that most people either abstain from alcohol or drink within safe limits (<=14/week and <=4 drinks/occasion for males, <=7/weeks and <= 3 drinks/occasion for females) and that the risk for alcohol disorders and other health effects rises proportionally with the number of drinks per week and how often a drinker exceeds daily limits.  Discussed cessation/primary prevention of drug use and availability of treatment for abuse.   Diet: Encouraged to adjust caloric intake to maintain  or achieve ideal body weight, to reduce intake of dietary saturated fat and total fat, to limit sodium intake by avoiding high sodium foods and not adding table salt, and to maintain adequate dietary potassium and calcium preferably from fresh fruits, vegetables, and low-fat dairy products.    Stressed the importance of regular exercise  Injury prevention: Discussed safety belts, safety helmets, smoke detector, smoking near bedding or upholstery.   Dental health: Discussed importance of regular tooth brushing, flossing, and dental visits.    NEXT PREVENTATIVE PHYSICAL  DUE IN 1 YEAR. Return in about 4 weeks (around 11/16/2024) for Migraines + 6 month visit for Anxiety.           "

## 2024-10-20 ENCOUNTER — Ambulatory Visit: Payer: Self-pay | Admitting: Nurse Practitioner

## 2024-10-20 DIAGNOSIS — G43909 Migraine, unspecified, not intractable, without status migrainosus: Secondary | ICD-10-CM | POA: Insufficient documentation

## 2024-10-20 LAB — LIPID PANEL W/O CHOL/HDL RATIO
Cholesterol, Total: 163 mg/dL (ref 100–199)
HDL: 67 mg/dL
LDL Chol Calc (NIH): 78 mg/dL (ref 0–99)
Triglycerides: 100 mg/dL (ref 0–149)
VLDL Cholesterol Cal: 18 mg/dL (ref 5–40)

## 2024-10-20 LAB — COMPREHENSIVE METABOLIC PANEL WITH GFR
ALT: 12 IU/L (ref 0–32)
AST: 21 IU/L (ref 0–40)
Albumin: 4.7 g/dL (ref 4.0–5.0)
Alkaline Phosphatase: 44 IU/L (ref 41–116)
BUN/Creatinine Ratio: 16 (ref 9–23)
BUN: 10 mg/dL (ref 6–20)
Bilirubin Total: 0.7 mg/dL (ref 0.0–1.2)
CO2: 25 mmol/L (ref 20–29)
Calcium: 9.3 mg/dL (ref 8.7–10.2)
Chloride: 102 mmol/L (ref 96–106)
Creatinine, Ser: 0.61 mg/dL (ref 0.57–1.00)
Globulin, Total: 2.2 g/dL (ref 1.5–4.5)
Glucose: 88 mg/dL (ref 70–99)
Potassium: 4.2 mmol/L (ref 3.5–5.2)
Sodium: 140 mmol/L (ref 134–144)
Total Protein: 6.9 g/dL (ref 6.0–8.5)
eGFR: 129 mL/min/1.73

## 2024-10-20 LAB — CBC WITH DIFFERENTIAL/PLATELET
Basophils Absolute: 0.1 x10E3/uL (ref 0.0–0.2)
Basos: 1 %
EOS (ABSOLUTE): 0.2 x10E3/uL (ref 0.0–0.4)
Eos: 3 %
Hematocrit: 43 % (ref 34.0–46.6)
Hemoglobin: 14.3 g/dL (ref 11.1–15.9)
Immature Grans (Abs): 0 x10E3/uL (ref 0.0–0.1)
Immature Granulocytes: 0 %
Lymphocytes Absolute: 2 x10E3/uL (ref 0.7–3.1)
Lymphs: 27 %
MCH: 30.1 pg (ref 26.6–33.0)
MCHC: 33.3 g/dL (ref 31.5–35.7)
MCV: 91 fL (ref 79–97)
Monocytes Absolute: 0.7 x10E3/uL (ref 0.1–0.9)
Monocytes: 10 %
Neutrophils Absolute: 4.3 x10E3/uL (ref 1.4–7.0)
Neutrophils: 59 %
Platelets: 269 x10E3/uL (ref 150–450)
RBC: 4.75 x10E6/uL (ref 3.77–5.28)
RDW: 12.2 % (ref 11.7–15.4)
WBC: 7.4 x10E3/uL (ref 3.4–10.8)

## 2024-10-20 LAB — TSH: TSH: 0.583 u[IU]/mL (ref 0.450–4.500)

## 2024-10-20 NOTE — Assessment & Plan Note (Signed)
 Ongoing, with continued elevation in scores however she wishes to maintain current doses.  Denies SI/HI.  Continue Effexor  75 MG daily and recommend she take Buspar  10 MG BID, will adjust further as needed, educated her on medications and possible side effects. Recommend meditation and relaxation exercises at home + continue therapy sessions.  Discussed with her if needed could add on low dose Propranolol to assist with anxiety and tremors, could take on PRN basis.  Educated her on this medication and side effects.  Her HR at baseline is in 90 range.  Refills sent.  Recommend return to therapy sessions.

## 2024-10-20 NOTE — Assessment & Plan Note (Signed)
 Currently improving, but has intermittent episodes. More when stressors present. Trial Imitrex  as needed. Educated her on this and side effects to monitor for. Continue OTC regimen as needed, which offers occasional benefit. No red flags and neuro exam reassuring. Could consider Propranolol in future for preventative if needed + this could offer benefit to anxiety as well.

## 2024-10-20 NOTE — Assessment & Plan Note (Signed)
 Ongoing, refills sent in.  Labs obtained today.  Will place new referral to GYN for ongoing care.

## 2024-10-20 NOTE — Progress Notes (Signed)
 Contacted via MyChart  Good morning Brenda Rangel, your labs have returned and overall look fantastic. No changes needed. Great job!! Keep being amazing!!  Thank you for allowing me to participate in your care.  I appreciate you. Kindest regards, Clytie Shetley

## 2024-11-10 NOTE — Patient Instructions (Incomplete)
 Be Involved in Caring For Your Health:  Taking Medications When medications are taken as directed, they can greatly improve your health. But if they are not taken as prescribed, they may not work. In some cases, not taking them correctly can be harmful. To help ensure your treatment remains effective and safe, understand your medications and how to take them. Bring your medications to each visit for review by your provider.  Your lab results, notes, and after visit summary will be available on My Chart. We strongly encourage you to use this feature. If lab results are abnormal the clinic will contact you with the appropriate steps. If the clinic does not contact you assume the results are satisfactory. You can always view your results on My Chart. If you have questions regarding your health or results, please contact the clinic during office hours. You can also ask questions on My Chart.  We at [facility] are grateful that you chose [place] to provide your care. We strive to provide evidence-based and compassionate care and are always looking for feedback. If you get a survey from the clinic please complete this so we can hear your opinions.  Migraine Headache A migraine headache is a very strong throbbing pain on one or both sides of your head. This type of headache can also cause other symptoms. It can last from 4 hours to 3 days. Talk with your doctor about what things may bring on (trigger) this condition. What are the causes? The exact cause of a migraine is not known. This condition may be brought on or caused by: Smoking. Medicines, such as: Medicine used to treat chest pain (nitroglycerin). Birth control pills. Estrogen. Some blood pressure medicines. Certain substances in some foods or drinks. Foods and drinks, such as: Cheese. Chocolate. Alcohol. Caffeine. Doing physical activity that is very hard. Other things that may trigger a migraine headache  include: Periods. Pregnancy. Hunger. Stress. Getting too much or too little sleep. Weather changes. Feeling tired (fatigue). What increases the risk? Being 24-46 years old. Being female. Having a family history of migraine headaches. Being Caucasian. Having a mental health condition, such as being sad (depressed) or feeling worried or nervous (anxious). Being very overweight (obese). What are the signs or symptoms? A throbbing pain. This pain may: Happen in any area of the head, such as on one or both sides. Make it hard to do daily activities. Get worse with physical activity. Get worse around bright lights, loud noises, or smells. Other symptoms may include: Feeling like you may vomit (nauseous). Vomiting. Dizziness. Before a migraine headache starts, you may get warning signs (an aura). An aura may include: Seeing flashing lights or having blind spots. Seeing bright spots, halos, or zigzag lines. Having tunnel vision or blurred vision. Having numbness or a tingling feeling. Having trouble talking. Having weak muscles. After a migraine ends, you may have symptoms. These may include: Tiredness. Trouble thinking (concentrating). How is this treated? Taking medicines that: Relieve pain. Relieve the feeling like you may vomit. Prevent migraine headaches. Treatment may also include: Acupuncture. Lifestyle changes like avoiding foods that bring on migraine headaches. Learning ways to control your body functions (biofeedback). Therapy to help you know and deal with negative thoughts (cognitive behavioral therapy). Follow these instructions at home: Medicines Take over-the-counter and prescription medicines only as told by your doctor. If told, take steps to prevent problems with pooping (constipation). You may need to: Drink enough fluid to keep your pee (urine) pale yellow. Take  medicines. You will be told what medicines to take. Eat foods that are high in fiber. These  include beans, whole grains, and fresh fruits and vegetables. Limit foods that are high in fat and sugar. These include fried or sweet foods. Ask your doctor if you should avoid driving or using machines while you are taking your medicine. Lifestyle  Do not drink alcohol. Do not smoke or use any products that contain nicotine or tobacco. If you need help quitting, ask your doctor. Get 7-9 hours of sleep each night, or the amount recommended by your doctor. Find ways to deal with stress, such as meditation, deep breathing, or yoga. Try to exercise often. This can help lessen how bad and how often your migraines happen. General instructions Keep a journal to find out what may bring on your migraine headaches. This can help you avoid those things. For example, write down: What you eat and drink. How much sleep you get. Any change to your medicines or diet. If you have a migraine headache: Avoid things that make your symptoms worse, such as bright lights. Lie down in a dark, quiet room. Do not drive or use machinery. Ask your doctor what activities are safe for you. Where to find more information Coalition for Headache and Migraine Patients (CHAMP): headachemigraine.org American Migraine Foundation: americanmigrainefoundation.org National Headache Foundation: headaches.org Contact a doctor if: You get a migraine headache that is different or worse than others you have had. You have more than 15 days of headaches in one month. Get help right away if: Your migraine headache gets very bad. Your migraine headache lasts more than 72 hours. You have a fever or stiff neck. You have trouble seeing. Your muscles feel weak or like you cannot control them. You lose your balance a lot. You have trouble walking. You faint. You have a seizure. This information is not intended to replace advice given to you by your health care provider. Make sure you discuss any questions you have with your health  care provider. Document Revised: 05/31/2022 Document Reviewed: 05/31/2022 Elsevier Patient Education  2024 [employer].

## 2024-11-16 ENCOUNTER — Ambulatory Visit: Admitting: Nurse Practitioner

## 2025-04-26 ENCOUNTER — Ambulatory Visit: Admitting: Nurse Practitioner
